# Patient Record
Sex: Male | Born: 1955 | Race: Black or African American | Hispanic: No | Marital: Single | Smoking: Current every day smoker
Health system: Southern US, Community
[De-identification: ages and names within clinical notes are randomized; demographics above are authoritative.]

## PROBLEM LIST (undated history)

## (undated) DIAGNOSIS — K219 Gastro-esophageal reflux disease without esophagitis: Secondary | ICD-10-CM

## (undated) DIAGNOSIS — N400 Enlarged prostate without lower urinary tract symptoms: Secondary | ICD-10-CM

## (undated) DIAGNOSIS — R519 Headache, unspecified: Secondary | ICD-10-CM

## (undated) DIAGNOSIS — T148XXA Other injury of unspecified body region, initial encounter: Secondary | ICD-10-CM

## (undated) DIAGNOSIS — M1990 Unspecified osteoarthritis, unspecified site: Secondary | ICD-10-CM

## (undated) DIAGNOSIS — I10 Essential (primary) hypertension: Secondary | ICD-10-CM

## (undated) DIAGNOSIS — M549 Dorsalgia, unspecified: Secondary | ICD-10-CM

## (undated) DIAGNOSIS — B1920 Unspecified viral hepatitis C without hepatic coma: Secondary | ICD-10-CM

## (undated) DIAGNOSIS — J189 Pneumonia, unspecified organism: Secondary | ICD-10-CM

## (undated) DIAGNOSIS — R51 Headache: Secondary | ICD-10-CM

## (undated) DIAGNOSIS — IMO0001 Reserved for inherently not codable concepts without codable children: Secondary | ICD-10-CM

## (undated) DIAGNOSIS — F1011 Alcohol abuse, in remission: Secondary | ICD-10-CM

## (undated) DIAGNOSIS — I82409 Acute embolism and thrombosis of unspecified deep veins of unspecified lower extremity: Secondary | ICD-10-CM

## (undated) HISTORY — PX: WRIST FRACTURE SURGERY: SHX121

## (undated) HISTORY — DX: Unspecified viral hepatitis C without hepatic coma: B19.20

---

## 2003-10-28 ENCOUNTER — Encounter: Admission: RE | Admit: 2003-10-28 | Discharge: 2003-10-28 | Payer: Self-pay | Admitting: Family Medicine

## 2003-12-09 ENCOUNTER — Encounter: Admission: RE | Admit: 2003-12-09 | Discharge: 2003-12-09 | Payer: Self-pay | Admitting: Orthopedic Surgery

## 2004-09-28 ENCOUNTER — Emergency Department (HOSPITAL_COMMUNITY): Admission: EM | Admit: 2004-09-28 | Discharge: 2004-09-28 | Payer: Self-pay | Admitting: Emergency Medicine

## 2009-03-31 ENCOUNTER — Emergency Department (HOSPITAL_COMMUNITY): Admission: EM | Admit: 2009-03-31 | Discharge: 2009-03-31 | Payer: Self-pay | Admitting: Emergency Medicine

## 2009-05-26 ENCOUNTER — Emergency Department (HOSPITAL_COMMUNITY): Admission: EM | Admit: 2009-05-26 | Discharge: 2009-05-26 | Payer: Self-pay | Admitting: Emergency Medicine

## 2010-04-10 ENCOUNTER — Emergency Department (HOSPITAL_COMMUNITY): Admission: EM | Admit: 2010-04-10 | Discharge: 2010-04-10 | Payer: Self-pay | Admitting: Emergency Medicine

## 2010-04-10 ENCOUNTER — Observation Stay (HOSPITAL_COMMUNITY): Admission: EM | Admit: 2010-04-10 | Discharge: 2010-04-11 | Payer: Self-pay | Admitting: Emergency Medicine

## 2010-04-10 ENCOUNTER — Ambulatory Visit (HOSPITAL_COMMUNITY): Admission: EM | Admit: 2010-04-10 | Discharge: 2010-04-10 | Payer: Self-pay | Admitting: Emergency Medicine

## 2010-04-26 ENCOUNTER — Ambulatory Visit (HOSPITAL_COMMUNITY): Admission: RE | Admit: 2010-04-26 | Discharge: 2010-04-26 | Payer: Self-pay | Admitting: Urology

## 2010-06-15 ENCOUNTER — Emergency Department (HOSPITAL_COMMUNITY)
Admission: EM | Admit: 2010-06-15 | Discharge: 2010-06-15 | Payer: Self-pay | Source: Home / Self Care | Admitting: Emergency Medicine

## 2010-06-17 ENCOUNTER — Emergency Department (HOSPITAL_COMMUNITY): Admission: EM | Admit: 2010-06-17 | Discharge: 2010-05-07 | Payer: Self-pay | Admitting: Emergency Medicine

## 2010-07-31 ENCOUNTER — Encounter: Payer: Self-pay | Admitting: Urology

## 2010-09-21 LAB — URINE CULTURE
Colony Count: NO GROWTH
Culture  Setup Time: 201112061936
Culture: NO GROWTH

## 2010-09-21 LAB — URINALYSIS, ROUTINE W REFLEX MICROSCOPIC
Bilirubin Urine: NEGATIVE
Glucose, UA: NEGATIVE mg/dL
Ketones, ur: NEGATIVE mg/dL
Leukocytes, UA: NEGATIVE
Nitrite: NEGATIVE
Protein, ur: NEGATIVE mg/dL
Specific Gravity, Urine: 1.009 (ref 1.005–1.030)
Urobilinogen, UA: 0.2 mg/dL (ref 0.0–1.0)
pH: 5.5 (ref 5.0–8.0)

## 2010-09-21 LAB — POCT I-STAT, CHEM 8
BUN: 8 mg/dL (ref 6–23)
Calcium, Ion: 1.25 mmol/L (ref 1.12–1.32)
Chloride: 106 mEq/L (ref 96–112)
Creatinine, Ser: 0.9 mg/dL (ref 0.4–1.5)
Glucose, Bld: 86 mg/dL (ref 70–99)
HCT: 45 % (ref 39.0–52.0)
Hemoglobin: 15.3 g/dL (ref 13.0–17.0)
Potassium: 4.2 mEq/L (ref 3.5–5.1)
Sodium: 141 mEq/L (ref 135–145)
TCO2: 26 mmol/L (ref 0–100)

## 2010-09-21 LAB — URINE MICROSCOPIC-ADD ON

## 2010-09-23 LAB — BASIC METABOLIC PANEL
BUN: 11 mg/dL (ref 6–23)
BUN: 7 mg/dL (ref 6–23)
CO2: 22 mEq/L (ref 19–32)
CO2: 27 mEq/L (ref 19–32)
Calcium: 8.6 mg/dL (ref 8.4–10.5)
Calcium: 8.6 mg/dL (ref 8.4–10.5)
Chloride: 101 mEq/L (ref 96–112)
Chloride: 107 mEq/L (ref 96–112)
Creatinine, Ser: 0.67 mg/dL (ref 0.4–1.5)
Creatinine, Ser: 0.76 mg/dL (ref 0.4–1.5)
GFR calc Af Amer: >60 mL/min (ref >60)
GFR calc Af Amer: >60 mL/min (ref >60)
GFR calc non Af Amer: >60 mL/min (ref >60)
GFR calc non Af Amer: >60 mL/min (ref >60)
Glucose, Bld: 130 mg/dL — ABNORMAL HIGH (ref 70–99)
Glucose, Bld: 88 mg/dL (ref 70–99)
Potassium: 3.7 mEq/L (ref 3.5–5.1)
Potassium: 3.7 mEq/L (ref 3.5–5.1)
Sodium: 134 mEq/L — ABNORMAL LOW (ref 135–145)
Sodium: 138 mEq/L (ref 135–145)

## 2010-09-23 LAB — CBC
HCT: 29.4 % — ABNORMAL LOW (ref 39.0–52.0)
HCT: 30.8 % — ABNORMAL LOW (ref 39.0–52.0)
HCT: 36 % — ABNORMAL LOW (ref 39.0–52.0)
Hemoglobin: 10.3 g/dL — ABNORMAL LOW (ref 13.0–17.0)
Hemoglobin: 10.7 g/dL — ABNORMAL LOW (ref 13.0–17.0)
Hemoglobin: 12.6 g/dL — ABNORMAL LOW (ref 13.0–17.0)
MCH: 35.4 pg — ABNORMAL HIGH (ref 26.0–34.0)
MCH: 35.4 pg — ABNORMAL HIGH (ref 26.0–34.0)
MCH: 35.6 pg — ABNORMAL HIGH (ref 26.0–34.0)
MCHC: 34.9 g/dL (ref 30.0–36.0)
MCHC: 34.9 g/dL (ref 30.0–36.0)
MCHC: 35 g/dL (ref 30.0–36.0)
MCV: 101.4 fL — ABNORMAL HIGH (ref 78.0–100.0)
MCV: 101.6 fL — ABNORMAL HIGH (ref 78.0–100.0)
MCV: 101.7 fL — ABNORMAL HIGH (ref 78.0–100.0)
Platelets: 133 10*3/uL — ABNORMAL LOW (ref 150–400)
Platelets: 145 10*3/uL — ABNORMAL LOW (ref 150–400)
Platelets: 166 10*3/uL (ref 150–400)
RBC: 2.9 MIL/uL — ABNORMAL LOW (ref 4.22–5.81)
RBC: 3.03 MIL/uL — ABNORMAL LOW (ref 4.22–5.81)
RBC: 3.53 MIL/uL — ABNORMAL LOW (ref 4.22–5.81)
RDW: 13.4 % (ref 11.5–15.5)
RDW: 13.5 % (ref 11.5–15.5)
RDW: 13.6 % (ref 11.5–15.5)
WBC: 6.3 10*3/uL (ref 4.0–10.5)
WBC: 7.3 10*3/uL (ref 4.0–10.5)
WBC: 8.6 10*3/uL (ref 4.0–10.5)

## 2010-09-23 LAB — DIFFERENTIAL
Basophils Absolute: 0 10*3/uL (ref 0.0–0.1)
Basophils Absolute: 0 10*3/uL (ref 0.0–0.1)
Basophils Relative: 0 % (ref 0–1)
Basophils Relative: 1 % (ref 0–1)
Eosinophils Absolute: 0 10*3/uL (ref 0.0–0.7)
Eosinophils Absolute: 0 10*3/uL (ref 0.0–0.7)
Eosinophils Relative: 0 % (ref 0–5)
Eosinophils Relative: 0 % (ref 0–5)
Lymphocytes Relative: 33 % (ref 12–46)
Lymphocytes Relative: 35 % (ref 12–46)
Lymphs Abs: 2.6 10*3/uL (ref 0.7–4.0)
Lymphs Abs: 2.9 10*3/uL (ref 0.7–4.0)
Monocytes Absolute: 0.6 10*3/uL (ref 0.1–1.0)
Monocytes Absolute: 0.8 10*3/uL (ref 0.1–1.0)
Monocytes Relative: 11 % (ref 3–12)
Monocytes Relative: 7 % (ref 3–12)
Neutro Abs: 3.9 10*3/uL (ref 1.7–7.7)
Neutro Abs: 5 10*3/uL (ref 1.7–7.7)
Neutrophils Relative %: 54 % (ref 43–77)
Neutrophils Relative %: 59 % (ref 43–77)

## 2010-09-23 LAB — POCT I-STAT, CHEM 8
BUN: 9 mg/dL (ref 6–23)
Calcium, Ion: 1.11 mmol/L — ABNORMAL LOW (ref 1.12–1.32)
Chloride: 104 mEq/L (ref 96–112)
Creatinine, Ser: 0.7 mg/dL (ref 0.4–1.5)
Glucose, Bld: 87 mg/dL (ref 70–99)
HCT: 35 % — ABNORMAL LOW (ref 39.0–52.0)
Hemoglobin: 11.9 g/dL — ABNORMAL LOW (ref 13.0–17.0)
Potassium: 4 mEq/L (ref 3.5–5.1)
Sodium: 137 mEq/L (ref 135–145)
TCO2: 24 mmol/L (ref 0–100)

## 2010-09-23 LAB — APTT: aPTT: 30 seconds (ref 24–37)

## 2010-09-23 LAB — PROTIME-INR
INR: 1.08 (ref 0.00–1.49)
INR: 1.08 (ref 0.00–1.49)
Prothrombin Time: 14.2 seconds (ref 11.6–15.2)
Prothrombin Time: 14.2 seconds (ref 11.6–15.2)

## 2010-09-23 LAB — SAMPLE TO BLOOD BANK

## 2010-11-26 NOTE — Op Note (Signed)
NAME:  Danny Hansen, Danny Hansen NO.:  [id number]   MEDICAL RECORD NO.:  [id number]          PATIENT TYPE:  EMS   LOCATION:  ED                           FACILITY:  [facility]   PHYSICIAN:  Cindee Salt, M.D.       DATE OF BIRTH:  1955-12-10   DATE OF PROCEDURE:  09/28/2004  DATE OF DISCHARGE:                                 OPERATIVE REPORT   PREOPERATIVE DIAGNOSIS:  Skeletonization distal phalanx right middle finger.   POSTOPERATIVE DIAGNOSIS:  Skeletonization distal phalanx right middle  finger.   OPERATION:  Revision amputation right middle finger.   SURGEON:  Cindee Salt, M.D.   ASSISTANTCarolyne Fiscal.   ANESTHESIA:  Metacarpal block.   HISTORY:  The patient is a 55 year old male who suffered an amputation to  the tip of his right middle finger in the fan belt of a car when a fellow  worker suddenly turned this on.  He is referred from urgent care with loss  of the entire pulp dorsal component. The remainder of the fingertip is  skeletonized.  There is volar soft tissue, no nail.  The amputated specimen  is not available.   DESCRIPTION OF PROCEDURE:  The patient was given a metacarpal block with 1%  Xylocaine without epinephrine, prepped using Betadine solution.  A Penrose  drain was used for tourniquet control.  The nail matrix was then excised  with sharp dissection, taking care to remove the corners.  This was excised  in toto.  A rongeur was then used to cut the bone back to the level of the  dorsal skin.  The volar fatty tissue was then dorsally rotated and sutured,  covering the entire exposed bone.  This was sutured into position after  irrigation with interrupted 5-0 chromic sutures fully covering the exposed  bone.  The __________ would be allowed to reepithelialize.  A sterile  compressive dressing and splint was applied.  The patient tolerated the  procedure well.  He is discharged home to return to the [facility] of  [place] in one week on Vicodin and  Keflex.      GK/MEDQ  D:  09/28/2004  T:  09/28/2004  Job:  [id number]

## 2013-01-27 ENCOUNTER — Encounter (HOSPITAL_COMMUNITY): Payer: Self-pay | Admitting: Emergency Medicine

## 2013-01-27 ENCOUNTER — Emergency Department (HOSPITAL_COMMUNITY)
Admission: EM | Admit: 2013-01-27 | Discharge: 2013-01-27 | Disposition: A | Discharge status: Home / Self Care | Payer: Self-pay | Attending: Emergency Medicine | Admitting: Emergency Medicine

## 2013-01-27 DIAGNOSIS — W1809XA Striking against other object with subsequent fall, initial encounter: Secondary | ICD-10-CM | POA: Insufficient documentation

## 2013-01-27 DIAGNOSIS — S0100XA Unspecified open wound of scalp, initial encounter: Secondary | ICD-10-CM | POA: Insufficient documentation

## 2013-01-27 DIAGNOSIS — F10220 Alcohol dependence with intoxication, uncomplicated: Secondary | ICD-10-CM

## 2013-01-27 DIAGNOSIS — F17200 Nicotine dependence, unspecified, uncomplicated: Secondary | ICD-10-CM | POA: Insufficient documentation

## 2013-01-27 DIAGNOSIS — Y939 Activity, unspecified: Secondary | ICD-10-CM | POA: Insufficient documentation

## 2013-01-27 DIAGNOSIS — Z23 Encounter for immunization: Secondary | ICD-10-CM | POA: Insufficient documentation

## 2013-01-27 DIAGNOSIS — Y929 Unspecified place or not applicable: Secondary | ICD-10-CM | POA: Insufficient documentation

## 2013-01-27 DIAGNOSIS — F10229 Alcohol dependence with intoxication, unspecified: Secondary | ICD-10-CM | POA: Insufficient documentation

## 2013-01-27 DIAGNOSIS — S0101XA Laceration without foreign body of scalp, initial encounter: Secondary | ICD-10-CM

## 2013-01-27 LAB — CBC WITH DIFFERENTIAL/PLATELET
Basophils Absolute: 0 10*3/uL (ref 0.0–0.1)
Basophils Relative: 0 % (ref 0–1)
Eosinophils Relative: 1 % (ref 0–5)
HCT: 35.6 % — ABNORMAL LOW (ref 39.0–52.0)
Hemoglobin: 12.4 g/dL — ABNORMAL LOW (ref 13.0–17.0)
Lymphocytes Relative: 75 % — ABNORMAL HIGH (ref 12–46)
Lymphs Abs: 5.4 10*3/uL — ABNORMAL HIGH (ref 0.7–4.0)
MCHC: 34.8 g/dL (ref 30.0–36.0)
MCV: 98.1 fL (ref 78.0–100.0)
Monocytes Absolute: 0.4 10*3/uL (ref 0.1–1.0)
Neutro Abs: 1.3 10*3/uL — ABNORMAL LOW (ref 1.7–7.7)
Neutrophils Relative %: 17 % — ABNORMAL LOW (ref 43–77)
Platelets: 177 10*3/uL (ref 150–400)
RBC: 3.63 MIL/uL — ABNORMAL LOW (ref 4.22–5.81)
RDW: 13.6 % (ref 11.5–15.5)
WBC: 7.2 10*3/uL (ref 4.0–10.5)

## 2013-01-27 MED ORDER — SODIUM CHLORIDE 0.9 % IV BOLUS (SEPSIS)
1000.0000 mL | Freq: Once | INTRAVENOUS | Status: AC
  Administered 2013-01-27: 1000 mL via INTRAVENOUS

## 2013-01-27 MED ORDER — TETANUS-DIPHTH-ACELL PERTUSSIS 5-2.5-18.5 LF-MCG/0.5 IM SUSP
0.5000 mL | Freq: Once | INTRAMUSCULAR | Status: AC
  Administered 2013-01-27: 0.5 mL via INTRAMUSCULAR
  Filled 2013-01-27 (×2): qty 0.5

## 2013-01-27 NOTE — ED Provider Notes (Addendum)
Tripped and fell striking his left neck on a glass coffee table. Creating laceration. No other injury Patient had small arterial bleed at left posterior neck on arrival. There is no visible hematoma. Bleeding was stopped immediately with one figure-of-eight suture placed by resident physician. Patient is alert Glasgow Coma Score 15 no distress I supervised the laceration repair Doug Sou, MD 01/28/13 1610  Doug Sou, MD 02/06/13 1013

## 2013-01-27 NOTE — ED Notes (Signed)
Patient up and pacing around in the room.

## 2013-01-27 NOTE — ED Notes (Signed)
Patient reports started drinking at 1200, a fifth of vodka and margarita mix. Reports fell over glass table, then went to sleep about 1500, then about 1700 woke up in a pool of blood. Laceration behind left ear; was bleeding heavily until sutured by [facility]. VSS.

## 2013-01-27 NOTE — ED Notes (Signed)
Nicky Pugh, MD at bedside. Suturing bleeding wound at this time.

## 2013-01-27 NOTE — ED Notes (Signed)
Discharge and follow up instructions reviewed with pt. Pt verbalized understanding.  

## 2013-01-27 NOTE — ED Provider Notes (Signed)
History    CSN: [id number] Arrival date & time 01/27/13  1820  First MD Initiated Contact with Patient 01/27/13 1836     Chief Complaint  Patient presents with  . Fall  . Ear Laceration   (Consider location/radiation/quality/duration/timing/severity/associated sxs/prior Treatment) HPI Comments: Pt w/ no PMHx, was drinking large quantity ETOH today, fell into glass coffee table and passed out. Awoke several hrs later in pool of blood w/ laceration noted behind left ear. Brought to ED by POV, hypotensive w/ pressure 82/58 in triage. Bleeding controlled w/ pressure. Pt denies any complaints. States he does not remember falling.   Patient is a 57 y.o. male presenting with injury. The history is provided by the patient. No language interpreter was used.  Injury This is a new problem. The current episode started today. The problem occurs constantly. The problem has been unchanged. He has tried nothing for the symptoms. The treatment provided no relief.   History reviewed. No pertinent past medical history. History reviewed. No pertinent past surgical history. No family history on file. History  Substance Use Topics  . Smoking status: Current Every Day Smoker  . Smokeless tobacco: Not on file  . Alcohol Use: Yes    Review of Systems  Respiratory: Negative for shortness of breath.   Cardiovascular: Negative for leg swelling.  Gastrointestinal: Negative for diarrhea and constipation.  Genitourinary: Negative for dysuria and frequency.  Skin: Positive for wound. Negative for color change.  Neurological: Negative for dizziness.  Psychiatric/Behavioral: Negative for confusion and agitation.  All other systems reviewed and are negative.    Allergies  Review of patient's allergies indicates no known allergies.  Home Medications  No current outpatient prescriptions on file. BP 104/74  Pulse 97  Temp(Src) 98 F (36.7 C) (Oral)  Resp 20  Ht 5\' 6"  (1.676 m)  Wt 160 lb (72.576 kg)   BMI 25.84 kg/m2  SpO2 99% Physical Exam  Constitutional: He is oriented to person, place, and time. He appears well-developed and well-nourished. No distress.  HENT:  Head: Normocephalic and atraumatic.  Ears:  Eyes: EOM are normal. Pupils are equal, round, and reactive to light.  Neck: Normal range of motion. Neck supple.  Cardiovascular: Normal rate and regular rhythm.   Pulmonary/Chest: Effort normal. No respiratory distress.  Abdominal: Soft. He exhibits no distension.  Musculoskeletal: Normal range of motion. He exhibits no edema.  Neurological: He is alert and oriented to person, place, and time.  Skin: Skin is warm and dry.  Psychiatric: He has a normal mood and affect. His behavior is normal.    ED Course  LACERATION REPAIR Date/Time: 01/28/2013 2:58 AM Performed by: Audelia Hives Authorized by: Audelia Hives Consent: The procedure was performed in an emergent situation. Body area: head/neck Location details: scalp Laceration length: 1 cm Foreign bodies: no foreign bodies Tendon involvement: none Nerve involvement: none Vascular damage: yes (arterial) Anesthesia: local infiltration Local anesthetic: lidocaine 1% with epinephrine Anesthetic total: 3 ml Skin closure: 4-0 nylon Number of sutures: 2 Technique: simple (figure 8) Approximation: close Approximation difficulty: simple Patient tolerance: Patient tolerated the procedure well with no immediate complications. Comments: One figure 8 suture and one simple interrupted    (including critical care time) Labs Reviewed  CBC WITH DIFFERENTIAL   Results for orders placed during the hospital encounter of 01/27/13  CBC WITH DIFFERENTIAL      Result Value Range   WBC 7.2  4.0 - 10.5 K/uL   RBC 3.63 (*) 4.22 - 5.81 MIL/uL  Hemoglobin 12.4 (*) 13.0 - 17.0 g/dL   HCT 16.1 (*) 09.6 - 04.5 %   MCV 98.1  78.0 - 100.0 fL   MCH 34.2 (*) 26.0 - 34.0 pg   MCHC 34.8  30.0 - 36.0 g/dL   RDW 40.9  81.1 - 91.4 %    Platelets 177  150 - 400 K/uL   Neutrophils Relative % 17 (*) 43 - 77 %   Neutro Abs 1.3 (*) 1.7 - 7.7 K/uL   Lymphocytes Relative 75 (*) 12 - 46 %   Lymphs Abs 5.4 (*) 0.7 - 4.0 K/uL   Monocytes Relative 6  3 - 12 %   Monocytes Absolute 0.4  0.1 - 1.0 K/uL   Eosinophils Relative 1  0 - 5 %   Eosinophils Absolute 0.1  0.0 - 0.7 K/uL   Basophils Relative 0  0 - 1 %   Basophils Absolute 0.0  0.0 - 0.1 K/uL    No results found. No diagnosis found.  MDM  Exam as above, normotensive, intoxicated. Small 1cm laceration to left posterior ear w/ pulsatile bleeding - controlled w/ pressure, pulsatile bleeding - figure 8 suture resolved bleeding, no hematoma formation. Normotensive. No other signs of trauma on exam. tetanus updated, given 1 L IVF, orthostatics negative, Hgb 12.4. Observed in ED for several hours w/out hypotension. Clinically sober. Able to ambulate w/out event. Stable for d/c home. Given instructions to return in 5 days for suture removal. Given return precautions. D/c in good condition. Doubt intracranial abnormality, no bony tenderness or focal neuro findings once reassessed and sober.   Case d/w Dr Ethelda Chick  1. Laceration of scalp with complication, initial encounter   2. Alcohol intoxication in active alcoholic, uncomplicated    [facility] EMERGENCY DEPARTMENT [address] [id number] [place] [address] [phone] In 5 days for suture removal      Audelia Hives, MD 01/28/13 0300

## 2013-01-27 NOTE — ED Notes (Signed)
Pt reports that he has been drinking and fell onto a glass table. Pt presents with laceration to left ear. Pt went to sleep after incident.

## 2013-01-28 NOTE — ED Provider Notes (Signed)
I have personally seen and examined the patient.  I have discussed the plan of care with the resident.  I have reviewed the documentation on PMH/FH/Soc. History.  I have reviewed the documentation of the resident and agree.  Doug Sou, MD 01/28/13 1752

## 2013-02-01 ENCOUNTER — Emergency Department (HOSPITAL_COMMUNITY)
Admission: EM | Admit: 2013-02-01 | Discharge: 2013-02-01 | Disposition: A | Discharge status: Home / Self Care | Payer: Self-pay | Attending: Emergency Medicine | Admitting: Emergency Medicine

## 2013-02-01 ENCOUNTER — Encounter (HOSPITAL_COMMUNITY): Payer: Self-pay | Admitting: Emergency Medicine

## 2013-02-01 DIAGNOSIS — F17200 Nicotine dependence, unspecified, uncomplicated: Secondary | ICD-10-CM | POA: Insufficient documentation

## 2013-02-01 DIAGNOSIS — Z4802 Encounter for removal of sutures: Secondary | ICD-10-CM | POA: Insufficient documentation

## 2013-02-01 NOTE — ED Notes (Signed)
Pt here for suture removal from 7/20. Sutures intact behind left ear.

## 2013-02-01 NOTE — ED Notes (Signed)
Here for suture removal left ear that were placed 5 days ago area healing

## 2013-02-01 NOTE — ED Provider Notes (Signed)
  CSN: [id number]     Arrival date & time 02/01/13  1123 History     First MD Initiated Contact with Patient 02/01/13 1228     Chief Complaint  Patient presents with  . Suture / Staple Removal   (Consider location/radiation/quality/duration/timing/severity/associated sxs/prior Treatment) HPI Comments: 57 yo AAM presents for suture removal. Injury occurred 5 days ago after a fall on glass resulting in a laceration posterior to the L ear. He had one figure 8 suture and one simple interrupted suture placed at the time. He denies purulent drainage, fever, chills, swelling or redness of the area, headache, and any other pain since the sutures were placed.  No treatment of the area since the sutures were placed.    Patient is a 57 y.o. male presenting with suture removal. The history is provided by the patient.  Suture / Staple Removal    History reviewed. No pertinent past medical history. History reviewed. No pertinent past surgical history. No family history on file. History  Substance Use Topics  . Smoking status: Current Every Day Smoker  . Smokeless tobacco: Not on file  . Alcohol Use: Yes    Review of Systems  All other systems reviewed and are negative.    Allergies  Review of patient's allergies indicates no known allergies.  Home Medications   Current Outpatient Rx  Name  Route  Sig  Dispense  Refill  . acetaminophen (TYLENOL) 325 MG tablet   Oral   Take 650 mg by mouth every 6 (six) hours as needed for pain.          BP 128/80  Pulse 83  Temp(Src) 97.4 F (36.3 C)  Resp 16  SpO2 99% Physical Exam  Nursing note and vitals reviewed. Constitutional: He appears well-developed and well-nourished. No distress.  HENT:  Head: Normocephalic and atraumatic.  Cardiovascular: Normal rate, regular rhythm and normal heart sounds.   Pulmonary/Chest: Effort normal and breath sounds normal.  Neurological: He is alert.  Skin: He is not diaphoretic.  Sutures posterior to  the left ear intact.  Area clean and dry.  No drainage.  Skin well approximated.  No surrounding erythema or edema.  Psychiatric: He has a normal mood and affect.    ED Course   Procedures (including critical care time)  Labs Reviewed - No data to display No results found. 1. Visit for suture removal     MDM  Patient presenting for suture removal.  Skin appears to be well approximated.  No signs of infection at this time.  Sutures removed.  Return precautions given.  Pascal Lux [facility], PA-C 02/02/13 1302

## 2013-02-05 NOTE — ED Provider Notes (Signed)
Medical screening examination/treatment/procedure(s) were performed by non-physician practitioner and as supervising physician I was immediately available for consultation/collaboration.   Ashby Dawes, MD 02/05/13 [phone]

## 2013-07-11 DIAGNOSIS — I82409 Acute embolism and thrombosis of unspecified deep veins of unspecified lower extremity: Secondary | ICD-10-CM

## 2013-07-11 HISTORY — DX: Acute embolism and thrombosis of unspecified deep veins of unspecified lower extremity: I82.409

## 2014-02-08 DIAGNOSIS — T148XXA Other injury of unspecified body region, initial encounter: Secondary | ICD-10-CM

## 2014-02-08 HISTORY — DX: Other injury of unspecified body region, initial encounter: T14.8XXA

## 2014-02-25 ENCOUNTER — Emergency Department (HOSPITAL_COMMUNITY): Payer: No Typology Code available for payment source

## 2014-02-25 ENCOUNTER — Inpatient Hospital Stay (HOSPITAL_COMMUNITY)
Admission: EM | Admit: 2014-02-25 | Discharge: 2014-03-04 | DRG: 956 | Disposition: A | Discharge status: Rehabilitation Facility | Payer: No Typology Code available for payment source | Attending: Orthopaedic Surgery | Admitting: Orthopaedic Surgery

## 2014-02-25 ENCOUNTER — Encounter (HOSPITAL_COMMUNITY): Payer: Self-pay

## 2014-02-25 DIAGNOSIS — N35919 Unspecified urethral stricture, male, unspecified site: Secondary | ICD-10-CM | POA: Diagnosis present

## 2014-02-25 DIAGNOSIS — F10920 Alcohol use, unspecified with intoxication, uncomplicated: Secondary | ICD-10-CM

## 2014-02-25 DIAGNOSIS — S32409A Unspecified fracture of unspecified acetabulum, initial encounter for closed fracture: Secondary | ICD-10-CM | POA: Diagnosis present

## 2014-02-25 DIAGNOSIS — S72402B Unspecified fracture of lower end of left femur, initial encounter for open fracture type I or II: Secondary | ICD-10-CM | POA: Diagnosis present

## 2014-02-25 DIAGNOSIS — S32401A Unspecified fracture of right acetabulum, initial encounter for closed fracture: Secondary | ICD-10-CM | POA: Diagnosis present

## 2014-02-25 DIAGNOSIS — S72443B Displaced fracture of lower epiphysis (separation) of unspecified femur, initial encounter for open fracture type I or II: Principal | ICD-10-CM | POA: Diagnosis present

## 2014-02-25 DIAGNOSIS — B1920 Unspecified viral hepatitis C without hepatic coma: Secondary | ICD-10-CM | POA: Diagnosis present

## 2014-02-25 DIAGNOSIS — Z23 Encounter for immunization: Secondary | ICD-10-CM

## 2014-02-25 DIAGNOSIS — S3710XA Unspecified injury of ureter, initial encounter: Secondary | ICD-10-CM | POA: Diagnosis present

## 2014-02-25 DIAGNOSIS — B182 Chronic viral hepatitis C: Secondary | ICD-10-CM | POA: Diagnosis present

## 2014-02-25 DIAGNOSIS — F1910 Other psychoactive substance abuse, uncomplicated: Secondary | ICD-10-CM | POA: Diagnosis present

## 2014-02-25 DIAGNOSIS — D62 Acute posthemorrhagic anemia: Secondary | ICD-10-CM | POA: Diagnosis not present

## 2014-02-25 DIAGNOSIS — I10 Essential (primary) hypertension: Secondary | ICD-10-CM | POA: Diagnosis present

## 2014-02-25 DIAGNOSIS — S7292XC Unspecified fracture of left femur, initial encounter for open fracture type IIIA, IIIB, or IIIC: Secondary | ICD-10-CM | POA: Diagnosis present

## 2014-02-25 DIAGNOSIS — F17200 Nicotine dependence, unspecified, uncomplicated: Secondary | ICD-10-CM | POA: Diagnosis present

## 2014-02-25 DIAGNOSIS — F1020 Alcohol dependence, uncomplicated: Secondary | ICD-10-CM | POA: Diagnosis present

## 2014-02-25 HISTORY — DX: Benign prostatic hyperplasia without lower urinary tract symptoms: N40.0

## 2014-02-25 HISTORY — DX: Other injury of unspecified body region, initial encounter: T14.8XXA

## 2014-02-25 HISTORY — DX: Essential (primary) hypertension: I10

## 2014-02-25 LAB — COMPREHENSIVE METABOLIC PANEL
ALBUMIN: 3.4 g/dL — AB (ref 3.5–5.2)
ALT: 194 U/L — ABNORMAL HIGH (ref 0–53)
ANION GAP: 15 (ref 5–15)
AST: 265 U/L — ABNORMAL HIGH (ref 0–37)
Alkaline Phosphatase: 116 U/L (ref 39–117)
BUN: 12 mg/dL (ref 6–23)
CALCIUM: 8.9 mg/dL (ref 8.4–10.5)
CO2: 21 mEq/L (ref 19–32)
Chloride: 103 mEq/L (ref 96–112)
Creatinine, Ser: 0.83 mg/dL (ref 0.50–1.35)
GFR calc non Af Amer: >90 mL/min (ref >90)
Glucose, Bld: 141 mg/dL — ABNORMAL HIGH (ref 70–99)
Potassium: 3.9 mEq/L (ref 3.7–5.3)
Sodium: 139 mEq/L (ref 137–147)
TOTAL PROTEIN: 8.3 g/dL (ref 6.0–8.3)
Total Bilirubin: 0.5 mg/dL (ref 0.3–1.2)

## 2014-02-25 LAB — CBC WITH DIFFERENTIAL/PLATELET
BASOS PCT: 0 % (ref 0–1)
Basophils Absolute: 0 10*3/uL (ref 0.0–0.1)
EOS ABS: 0.1 10*3/uL (ref 0.0–0.7)
EOS PCT: 1 % (ref 0–5)
HCT: 40.7 % (ref 39.0–52.0)
Hemoglobin: 14.1 g/dL (ref 13.0–17.0)
LYMPHS ABS: 5.3 10*3/uL — AB (ref 0.7–4.0)
Lymphocytes Relative: 67 % — ABNORMAL HIGH (ref 12–46)
MCH: 35.3 pg — AB (ref 26.0–34.0)
MCHC: 34.6 g/dL (ref 30.0–36.0)
MCV: 101.8 fL — AB (ref 78.0–100.0)
Monocytes Absolute: 0.4 10*3/uL (ref 0.1–1.0)
Monocytes Relative: 6 % (ref 3–12)
Neutro Abs: 2.1 10*3/uL (ref 1.7–7.7)
Neutrophils Relative %: 26 % — ABNORMAL LOW (ref 43–77)
PLATELETS: 149 10*3/uL — AB (ref 150–400)
RBC: 4 MIL/uL — AB (ref 4.22–5.81)
RDW: 12.5 % (ref 11.5–15.5)
WBC: 8 10*3/uL (ref 4.0–10.5)

## 2014-02-25 LAB — ETHANOL: ALCOHOL ETHYL (B): 214 mg/dL — AB (ref 0–11)

## 2014-02-25 MED ORDER — TETANUS-DIPHTHERIA TOXOIDS TD 5-2 LFU IM INJ
0.5000 mL | INJECTION | Freq: Once | INTRAMUSCULAR | Status: DC
  Filled 2014-02-25: qty 0.5

## 2014-02-25 MED ORDER — TETANUS-DIPHTH-ACELL PERTUSSIS 5-2.5-18.5 LF-MCG/0.5 IM SUSP
0.5000 mL | Freq: Once | INTRAMUSCULAR | Status: AC
Start: 2014-02-25 — End: 2014-02-25
  Administered 2014-02-25: 0.5 mL via INTRAMUSCULAR

## 2014-02-25 MED ORDER — HYDROMORPHONE HCL PF 1 MG/ML IJ SOLN
1.0000 mg | Freq: Once | INTRAMUSCULAR | Status: AC
  Administered 2014-02-25: 1 mg via INTRAVENOUS
  Filled 2014-02-25: qty 1

## 2014-02-25 MED ORDER — IOHEXOL 300 MG/ML  SOLN
100.0000 mL | Freq: Once | INTRAMUSCULAR | Status: AC | PRN
  Administered 2014-02-25: 100 mL via INTRAVENOUS

## 2014-02-25 MED ORDER — SODIUM CHLORIDE 0.9 % IV SOLN
INTRAVENOUS | Status: AC | PRN
  Administered 2014-02-25: 125 mL/h via INTRAVENOUS

## 2014-02-25 MED ORDER — FENTANYL CITRATE 0.05 MG/ML IJ SOLN
INTRAMUSCULAR | Status: AC
  Administered 2014-02-25: 100 ug
  Filled 2014-02-25: qty 2

## 2014-02-25 MED ORDER — CEFAZOLIN SODIUM 1-5 GM-% IV SOLN
1.0000 g | Freq: Once | INTRAVENOUS | Status: AC
  Administered 2014-02-25: 1 g via INTRAVENOUS
  Filled 2014-02-25: qty 50

## 2014-02-25 MED ORDER — HYDROMORPHONE HCL PF 1 MG/ML IJ SOLN
2.0000 mg | Freq: Once | INTRAMUSCULAR | Status: AC
  Administered 2014-02-25: 2 mg via INTRAVENOUS
  Filled 2014-02-25: qty 2
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
## 2014-02-25 NOTE — ED Notes (Signed)
Dr. Magnus IvanBlackman and Ortho Tech. Splinted lt. Leg.
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
## 2014-02-25 NOTE — H&P (Signed)
  See Orthopedic consult note

## 2014-02-25 NOTE — Progress Notes (Signed)
Orthopedic Tech Progress Note Patient Details:  Georjean Modearnest Lee Hanning [birth year] [id number]  Ortho Devices Type of Ortho Device: Ace wrap;Stirrup splint;Post (long leg) splint Ortho Device/Splint Location: LLE Ortho Device/Splint Interventions: Ordered;Application   Jennye MoccasinHughes, Lochlyn Zullo Craig 02/25/2014, 11:01 PM

## 2014-02-25 NOTE — ED Notes (Signed)
Continues to sleep.  Urinal between legs, awaiting UA.  Pupils pinpoint; pt does awaken with tactile stimulation.  + bilat pedal pulses present. ST on monitor.  IV patent and infusing without difficulty.

## 2014-02-26 ENCOUNTER — Inpatient Hospital Stay (HOSPITAL_COMMUNITY): Payer: No Typology Code available for payment source

## 2014-02-26 ENCOUNTER — Encounter (HOSPITAL_COMMUNITY): Admission: EM | Disposition: A | Discharge status: Rehabilitation Facility | Payer: Self-pay | Source: Home / Self Care

## 2014-02-26 ENCOUNTER — Encounter (HOSPITAL_COMMUNITY): Payer: Self-pay | Admitting: General Practice

## 2014-02-26 ENCOUNTER — Encounter (HOSPITAL_COMMUNITY): Payer: No Typology Code available for payment source | Admitting: Anesthesiology

## 2014-02-26 ENCOUNTER — Inpatient Hospital Stay (HOSPITAL_COMMUNITY): Payer: No Typology Code available for payment source | Admitting: Anesthesiology

## 2014-02-26 DIAGNOSIS — F1020 Alcohol dependence, uncomplicated: Secondary | ICD-10-CM | POA: Diagnosis present

## 2014-02-26 DIAGNOSIS — D62 Acute posthemorrhagic anemia: Secondary | ICD-10-CM | POA: Diagnosis present

## 2014-02-26 DIAGNOSIS — F1910 Other psychoactive substance abuse, uncomplicated: Secondary | ICD-10-CM | POA: Diagnosis present

## 2014-02-26 DIAGNOSIS — F17200 Nicotine dependence, unspecified, uncomplicated: Secondary | ICD-10-CM | POA: Diagnosis present

## 2014-02-26 DIAGNOSIS — S31109A Unspecified open wound of abdominal wall, unspecified quadrant without penetration into peritoneal cavity, initial encounter: Secondary | ICD-10-CM

## 2014-02-26 DIAGNOSIS — S32409A Unspecified fracture of unspecified acetabulum, initial encounter for closed fracture: Secondary | ICD-10-CM

## 2014-02-26 DIAGNOSIS — S7290XA Unspecified fracture of unspecified femur, initial encounter for closed fracture: Secondary | ICD-10-CM

## 2014-02-26 DIAGNOSIS — I10 Essential (primary) hypertension: Secondary | ICD-10-CM | POA: Diagnosis present

## 2014-02-26 DIAGNOSIS — B1920 Unspecified viral hepatitis C without hepatic coma: Secondary | ICD-10-CM | POA: Diagnosis present

## 2014-02-26 DIAGNOSIS — S32401A Unspecified fracture of right acetabulum, initial encounter for closed fracture: Secondary | ICD-10-CM | POA: Diagnosis present

## 2014-02-26 DIAGNOSIS — Z23 Encounter for immunization: Secondary | ICD-10-CM | POA: Diagnosis not present

## 2014-02-26 DIAGNOSIS — S72443B Displaced fracture of lower epiphysis (separation) of unspecified femur, initial encounter for open fracture type I or II: Secondary | ICD-10-CM | POA: Diagnosis present

## 2014-02-26 DIAGNOSIS — S72402B Unspecified fracture of lower end of left femur, initial encounter for open fracture type I or II: Secondary | ICD-10-CM | POA: Diagnosis present

## 2014-02-26 DIAGNOSIS — N35919 Unspecified urethral stricture, male, unspecified site: Secondary | ICD-10-CM | POA: Diagnosis present

## 2014-02-26 HISTORY — PX: I&D EXTREMITY: SHX5045

## 2014-02-26 HISTORY — PX: EXTERNAL FIXATION LEG: SHX1549

## 2014-02-26 LAB — CBC
HCT: 33.6 % — ABNORMAL LOW (ref 39.0–52.0)
Hemoglobin: 11.6 g/dL — ABNORMAL LOW (ref 13.0–17.0)
MCH: 35 pg — ABNORMAL HIGH (ref 26.0–34.0)
MCHC: 34.5 g/dL (ref 30.0–36.0)
MCV: 101.5 fL — ABNORMAL HIGH (ref 78.0–100.0)
PLATELETS: 150 10*3/uL (ref 150–400)
RBC: 3.31 MIL/uL — ABNORMAL LOW (ref 4.22–5.81)
RDW: 12.6 % (ref 11.5–15.5)
WBC: 9.9 10*3/uL (ref 4.0–10.5)

## 2014-02-26 LAB — MRSA PCR SCREENING: MRSA by PCR: NEGATIVE

## 2014-02-26 LAB — ABO/RH: ABO/RH(D): O POS

## 2014-02-26 LAB — GAMMA GT: GGT: 268 U/L — AB (ref 7–51)

## 2014-02-26 LAB — BASIC METABOLIC PANEL
ANION GAP: 17 — AB (ref 5–15)
BUN: 11 mg/dL (ref 6–23)
CHLORIDE: 104 meq/L (ref 96–112)
CO2: 20 mEq/L (ref 19–32)
CREATININE: 0.66 mg/dL (ref 0.50–1.35)
Calcium: 8.5 mg/dL (ref 8.4–10.5)
GFR calc non Af Amer: >90 mL/min (ref >90)
Glucose, Bld: 154 mg/dL — ABNORMAL HIGH (ref 70–99)
Potassium: 4 mEq/L (ref 3.7–5.3)
Sodium: 141 mEq/L (ref 137–147)

## 2014-02-26 LAB — RAPID URINE DRUG SCREEN, HOSP PERFORMED
AMPHETAMINES: NOT DETECTED
Barbiturates: NOT DETECTED
Benzodiazepines: NOT DETECTED
Cocaine: POSITIVE — AB
Opiates: POSITIVE — AB
Tetrahydrocannabinol: NOT DETECTED

## 2014-02-26 SURGERY — EXTERNAL FIXATION, LOWER EXTREMITY
Anesthesia: General | Site: Leg Lower | Laterality: Left

## 2014-02-26 MED ORDER — TAMSULOSIN HCL 0.4 MG PO CAPS
0.4000 mg | ORAL_CAPSULE | Freq: Every day | ORAL | Status: DC
  Administered 2014-02-27 – 2014-03-03 (×4): 0.4 mg via ORAL
  Filled 2014-02-26 (×6): qty 1

## 2014-02-26 MED ORDER — PHENYLEPHRINE 40 MCG/ML (10ML) SYRINGE FOR IV PUSH (FOR BLOOD PRESSURE SUPPORT)
PREFILLED_SYRINGE | INTRAVENOUS | Status: AC
  Filled 2014-02-26: qty 10

## 2014-02-26 MED ORDER — LIDOCAINE HCL (CARDIAC) 20 MG/ML IV SOLN
INTRAVENOUS | Status: AC
  Filled 2014-02-26: qty 5

## 2014-02-26 MED ORDER — HYDROCODONE-ACETAMINOPHEN 5-325 MG PO TABS
1.0000 | ORAL_TABLET | ORAL | Status: DC | PRN
  Administered 2014-02-26 – 2014-02-27 (×2): 2 via ORAL
  Filled 2014-02-26 (×2): qty 2

## 2014-02-26 MED ORDER — LACTATED RINGERS IV SOLN: INTRAVENOUS | Status: DC

## 2014-02-26 MED ORDER — CLINDAMYCIN PHOSPHATE 900 MG/50ML IV SOLN
900.0000 mg | INTRAVENOUS | Status: AC
  Administered 2014-02-26: 900 mg via INTRAVENOUS
  Filled 2014-02-26: qty 50

## 2014-02-26 MED ORDER — ADULT MULTIVITAMIN W/MINERALS CH
1.0000 | ORAL_TABLET | Freq: Every day | ORAL | Status: DC
  Filled 2014-02-26: qty 1

## 2014-02-26 MED ORDER — LORAZEPAM 2 MG/ML IJ SOLN: 1.0000 mg | Freq: Four times a day (QID) | INTRAMUSCULAR | Status: DC | PRN

## 2014-02-26 MED ORDER — DEXTROSE 5 % IV SOLN
500.0000 mg | Freq: Four times a day (QID) | INTRAVENOUS | Status: DC | PRN
  Filled 2014-02-26: qty 5

## 2014-02-26 MED ORDER — HYDROMORPHONE HCL PF 1 MG/ML IJ SOLN
INTRAMUSCULAR | Status: AC
  Administered 2014-02-26: 0.5 mg via INTRAVENOUS
  Filled 2014-02-26: qty 1

## 2014-02-26 MED ORDER — SODIUM CHLORIDE 0.9 % IV SOLN
INTRAVENOUS | Status: DC
  Administered 2014-02-26: 03:00:00 via INTRAVENOUS

## 2014-02-26 MED ORDER — ONDANSETRON HCL 4 MG/2ML IJ SOLN
INTRAMUSCULAR | Status: DC | PRN
  Administered 2014-02-26: 4 mg via INTRAVENOUS

## 2014-02-26 MED ORDER — VITAMIN B-1 100 MG PO TABS
100.0000 mg | ORAL_TABLET | Freq: Every day | ORAL | Status: DC
Start: 2014-02-26 — End: 2014-02-26
  Filled 2014-02-26: qty 1

## 2014-02-26 MED ORDER — POLYETHYLENE GLYCOL 3350 17 G PO PACK
17.0000 g | PACK | Freq: Every day | ORAL | Status: DC | PRN
Start: 2014-02-26 — End: 2014-03-03
  Filled 2014-02-26: qty 1

## 2014-02-26 MED ORDER — MAGNESIUM CITRATE PO SOLN: 1.0000 | Freq: Once | ORAL | Status: AC | PRN

## 2014-02-26 MED ORDER — FENTANYL CITRATE 0.05 MG/ML IJ SOLN
INTRAMUSCULAR | Status: DC | PRN
  Administered 2014-02-26 (×3): 50 ug via INTRAVENOUS

## 2014-02-26 MED ORDER — HYDROMORPHONE HCL PF 1 MG/ML IJ SOLN
1.0000 mg | INTRAMUSCULAR | Status: DC | PRN
  Administered 2014-02-26 (×2): 1 mg via INTRAVENOUS
  Filled 2014-02-26 (×2): qty 1

## 2014-02-26 MED ORDER — METOCLOPRAMIDE HCL 5 MG/ML IJ SOLN: 5.0000 mg | Freq: Three times a day (TID) | INTRAMUSCULAR | Status: DC | PRN

## 2014-02-26 MED ORDER — PROPOFOL 10 MG/ML IV BOLUS
INTRAVENOUS | Status: DC | PRN
  Administered 2014-02-26: 200 mg via INTRAVENOUS

## 2014-02-26 MED ORDER — PHENYLEPHRINE HCL 10 MG/ML IJ SOLN
INTRAMUSCULAR | Status: DC | PRN
  Administered 2014-02-26: 80 ug via INTRAVENOUS

## 2014-02-26 MED ORDER — FENTANYL CITRATE 0.05 MG/ML IJ SOLN
INTRAMUSCULAR | Status: AC
  Filled 2014-02-26: qty 5

## 2014-02-26 MED ORDER — FOLIC ACID 1 MG PO TABS
1.0000 mg | ORAL_TABLET | Freq: Every day | ORAL | Status: DC
  Filled 2014-02-26: qty 1

## 2014-02-26 MED ORDER — DIPHENHYDRAMINE HCL 12.5 MG/5ML PO ELIX
25.0000 mg | ORAL_SOLUTION | ORAL | Status: DC | PRN
  Administered 2014-02-27 – 2014-03-02 (×3): 25 mg via ORAL
  Filled 2014-02-26 (×3): qty 10

## 2014-02-26 MED ORDER — MORPHINE SULFATE 2 MG/ML IJ SOLN
1.0000 mg | INTRAMUSCULAR | Status: DC | PRN
  Administered 2014-02-26 – 2014-02-27 (×5): 1 mg via INTRAVENOUS
  Filled 2014-02-26 (×6): qty 1

## 2014-02-26 MED ORDER — ONDANSETRON HCL 4 MG/2ML IJ SOLN
INTRAMUSCULAR | Status: AC
  Filled 2014-02-26: qty 2

## 2014-02-26 MED ORDER — SORBITOL 70 % SOLN: 30.0000 mL | Freq: Every day | Status: DC | PRN

## 2014-02-26 MED ORDER — ONDANSETRON HCL 4 MG/2ML IJ SOLN: 4.0000 mg | Freq: Four times a day (QID) | INTRAMUSCULAR | Status: DC | PRN

## 2014-02-26 MED ORDER — SUCCINYLCHOLINE CHLORIDE 20 MG/ML IJ SOLN
INTRAMUSCULAR | Status: DC | PRN
  Administered 2014-02-26: 100 mg via INTRAVENOUS

## 2014-02-26 MED ORDER — ONDANSETRON HCL 4 MG/2ML IJ SOLN: 4.0000 mg | Freq: Once | INTRAMUSCULAR | Status: DC | PRN

## 2014-02-26 MED ORDER — ONDANSETRON HCL 4 MG PO TABS: 4.0000 mg | ORAL_TABLET | Freq: Four times a day (QID) | ORAL | Status: DC | PRN

## 2014-02-26 MED ORDER — OXYCODONE HCL 5 MG PO TABS: 5.0000 mg | ORAL_TABLET | Freq: Once | ORAL | Status: DC | PRN

## 2014-02-26 MED ORDER — SODIUM CHLORIDE 0.9 % IV SOLN
INTRAVENOUS | Status: DC
  Administered 2014-02-26 – 2014-02-28 (×2): via INTRAVENOUS

## 2014-02-26 MED ORDER — THIAMINE HCL 100 MG/ML IJ SOLN
100.0000 mg | Freq: Every day | INTRAMUSCULAR | Status: DC
  Filled 2014-02-26: qty 1

## 2014-02-26 MED ORDER — METHOCARBAMOL 1000 MG/10ML IJ SOLN
500.0000 mg | Freq: Four times a day (QID) | INTRAVENOUS | Status: DC | PRN
  Filled 2014-02-26: qty 5

## 2014-02-26 MED ORDER — LORAZEPAM 1 MG PO TABS: 1.0000 mg | ORAL_TABLET | Freq: Four times a day (QID) | ORAL | Status: DC | PRN

## 2014-02-26 MED ORDER — PNEUMOCOCCAL VAC POLYVALENT 25 MCG/0.5ML IJ INJ
0.5000 mL | INJECTION | INTRAMUSCULAR | Status: DC
  Filled 2014-02-26: qty 0.5

## 2014-02-26 MED ORDER — MIDAZOLAM HCL 2 MG/2ML IJ SOLN
INTRAMUSCULAR | Status: AC
  Filled 2014-02-26: qty 2

## 2014-02-26 MED ORDER — ENOXAPARIN SODIUM 40 MG/0.4ML ~~LOC~~ SOLN
40.0000 mg | SUBCUTANEOUS | Status: DC
  Administered 2014-02-28: 40 mg via SUBCUTANEOUS
  Filled 2014-02-26 (×3): qty 0.4

## 2014-02-26 MED ORDER — SODIUM CHLORIDE 0.9 % IR SOLN
Status: DC | PRN
  Administered 2014-02-26: 1000 mL

## 2014-02-26 MED ORDER — MIDAZOLAM HCL 5 MG/5ML IJ SOLN
INTRAMUSCULAR | Status: DC | PRN
  Administered 2014-02-26: 1 mg via INTRAVENOUS

## 2014-02-26 MED ORDER — LACTATED RINGERS IV SOLN
INTRAVENOUS | Status: DC | PRN
  Administered 2014-02-26 (×2): via INTRAVENOUS

## 2014-02-26 MED ORDER — ENOXAPARIN SODIUM 40 MG/0.4ML ~~LOC~~ SOLN: 40.0000 mg | SUBCUTANEOUS | Status: DC

## 2014-02-26 MED ORDER — FENTANYL CITRATE 0.05 MG/ML IJ SOLN
INTRAMUSCULAR | Status: DC | PRN
Start: 2014-02-26 — End: 2014-02-26

## 2014-02-26 MED ORDER — OXYCODONE HCL 5 MG PO TABS
5.0000 mg | ORAL_TABLET | ORAL | Status: DC | PRN
  Administered 2014-02-26: 10 mg via ORAL
  Administered 2014-02-27 – 2014-03-01 (×9): 15 mg via ORAL
  Filled 2014-02-26: qty 3
  Filled 2014-02-26: qty 2
  Filled 2014-02-26 (×8): qty 3

## 2014-02-26 MED ORDER — BACITRACIN ZINC 500 UNIT/GM EX OINT
TOPICAL_OINTMENT | Freq: Two times a day (BID) | CUTANEOUS | Status: DC
  Administered 2014-02-26 – 2014-02-28 (×3): via TOPICAL
  Administered 2014-02-28: 1 via TOPICAL
  Administered 2014-03-01 – 2014-03-04 (×5): via TOPICAL
  Filled 2014-02-26: qty 28.35

## 2014-02-26 MED ORDER — CEFAZOLIN SODIUM 1-5 GM-% IV SOLN
1.0000 g | Freq: Three times a day (TID) | INTRAVENOUS | Status: DC
  Administered 2014-02-26: 1 g via INTRAVENOUS
  Filled 2014-02-26 (×3): qty 50

## 2014-02-26 MED ORDER — METHOCARBAMOL 500 MG PO TABS
500.0000 mg | ORAL_TABLET | Freq: Four times a day (QID) | ORAL | Status: DC | PRN
  Administered 2014-02-26 – 2014-03-01 (×6): 500 mg via ORAL
  Filled 2014-02-26 (×5): qty 1

## 2014-02-26 MED ORDER — HYDROMORPHONE HCL PF 1 MG/ML IJ SOLN
0.2500 mg | INTRAMUSCULAR | Status: DC | PRN
  Administered 2014-02-26 (×4): 0.5 mg via INTRAVENOUS

## 2014-02-26 MED ORDER — METOCLOPRAMIDE HCL 5 MG PO TABS
5.0000 mg | ORAL_TABLET | Freq: Three times a day (TID) | ORAL | Status: DC | PRN
  Filled 2014-02-26: qty 2

## 2014-02-26 MED ORDER — LIDOCAINE HCL (CARDIAC) 20 MG/ML IV SOLN
INTRAVENOUS | Status: DC | PRN
  Administered 2014-02-26: 100 mg via INTRAVENOUS

## 2014-02-26 MED ORDER — CEFAZOLIN SODIUM-DEXTROSE 2-3 GM-% IV SOLR
2.0000 g | Freq: Four times a day (QID) | INTRAVENOUS | Status: AC
  Administered 2014-02-26 – 2014-02-27 (×3): 2 g via INTRAVENOUS
  Filled 2014-02-26 (×3): qty 50

## 2014-02-26 MED ORDER — SENNA 8.6 MG PO TABS
1.0000 | ORAL_TABLET | Freq: Two times a day (BID) | ORAL | Status: DC
  Administered 2014-02-26 – 2014-03-04 (×8): 8.6 mg via ORAL
  Filled 2014-02-26 (×13): qty 1

## 2014-02-26 MED ORDER — OXYCODONE HCL 5 MG/5ML PO SOLN: 5.0000 mg | Freq: Once | ORAL | Status: DC | PRN

## 2014-02-26 SURGICAL SUPPLY — 77 items
BANDAGE ELASTIC 3 VELCRO ST LF (GAUZE/BANDAGES/DRESSINGS) IMPLANT
BAR 400MM (Trauma) ×2 IMPLANT
BAR EXFIX SM BONE 10.5X400 (Trauma) ×4 IMPLANT
BIT DRILL 3.5 SHOFT HALF PIN (BIT) ×3 IMPLANT
BLADE SURG 10 STRL SS (BLADE) ×3 IMPLANT
BNDG COHESIVE 1X5 TAN STRL LF (GAUZE/BANDAGES/DRESSINGS) IMPLANT
BNDG COHESIVE 4X5 TAN STRL (GAUZE/BANDAGES/DRESSINGS) ×3 IMPLANT
BNDG COHESIVE 6X5 TAN STRL LF (GAUZE/BANDAGES/DRESSINGS) ×6 IMPLANT
BNDG CONFORM 3 STRL LF (GAUZE/BANDAGES/DRESSINGS) IMPLANT
BNDG GAUZE ELAST 4 BULKY (GAUZE/BANDAGES/DRESSINGS) ×3 IMPLANT
BNDG GAUZE STRTCH 6 (GAUZE/BANDAGES/DRESSINGS) ×9 IMPLANT
CLAMP BAR TO BAR (Clamp) ×6 IMPLANT
CLAMP PIN TO BAR (Clamp) ×12 IMPLANT
CORDS BIPOLAR (ELECTRODE) IMPLANT
COVER SURGICAL LIGHT HANDLE (MISCELLANEOUS) ×3 IMPLANT
CUFF TOURNIQUET SINGLE 24IN (TOURNIQUET CUFF) IMPLANT
CUFF TOURNIQUET SINGLE 34IN LL (TOURNIQUET CUFF) ×6 IMPLANT
CUFF TOURNIQUET SINGLE 44IN (TOURNIQUET CUFF) IMPLANT
DRAPE C-ARM 42X72 X-RAY (DRAPES) ×3 IMPLANT
DRAPE EXTREMITY BILATERAL (DRAPE) IMPLANT
DRAPE IMP U-DRAPE 54X76 (DRAPES) IMPLANT
DRAPE INCISE IOBAN 66X45 STRL (DRAPES) ×12 IMPLANT
DRAPE ORTHO SPLIT 77X108 STRL (DRAPES) ×4
DRAPE SURG 17X23 STRL (DRAPES) IMPLANT
DRAPE SURG ORHT 6 SPLT 77X108 (DRAPES) ×2 IMPLANT
DRAPE U-SHAPE 47X51 STRL (DRAPES) ×3 IMPLANT
DRSG MEPILEX BORDER 4X4 (GAUZE/BANDAGES/DRESSINGS) ×3 IMPLANT
DURAPREP 26ML APPLICATOR (WOUND CARE) ×3 IMPLANT
ELECT CAUTERY BLADE 6.4 (BLADE) ×3 IMPLANT
ELECT REM PT RETURN 9FT ADLT (ELECTROSURGICAL)
ELECTRODE REM PT RTRN 9FT ADLT (ELECTROSURGICAL) IMPLANT
FACESHIELD WRAPAROUND (MASK) IMPLANT
GAUZE SPONGE 4X4 12PLY STRL (GAUZE/BANDAGES/DRESSINGS) ×6 IMPLANT
GAUZE XEROFORM 1X8 LF (GAUZE/BANDAGES/DRESSINGS) ×3 IMPLANT
GAUZE XEROFORM 5X9 LF (GAUZE/BANDAGES/DRESSINGS) ×3 IMPLANT
GLOVE BIO SURGEON STRL SZ8 (GLOVE) ×3 IMPLANT
GLOVE ORTHO TXT STRL SZ7.5 (GLOVE) ×3 IMPLANT
GLOVE SURG SYN 7.5  E (GLOVE) ×4
GLOVE SURG SYN 7.5 E (GLOVE) ×2 IMPLANT
GOWN STRL REIN XL XLG (GOWN DISPOSABLE) ×3 IMPLANT
GOWN STRL REUS W/ TWL LRG LVL3 (GOWN DISPOSABLE) ×1 IMPLANT
GOWN STRL REUS W/ TWL XL LVL3 (GOWN DISPOSABLE) ×4 IMPLANT
GOWN STRL REUS W/TWL LRG LVL3 (GOWN DISPOSABLE) ×2
GOWN STRL REUS W/TWL XL LVL3 (GOWN DISPOSABLE) ×8
HANDPIECE INTERPULSE COAX TIP (DISPOSABLE)
KIT BASIN OR (CUSTOM PROCEDURE TRAY) ×3 IMPLANT
KIT ROOM TURNOVER OR (KITS) ×3 IMPLANT
MANIFOLD NEPTUNE II (INSTRUMENTS) ×3 IMPLANT
NS IRRIG 1000ML POUR BTL (IV SOLUTION) ×6 IMPLANT
PACK ORTHO EXTREMITY (CUSTOM PROCEDURE TRAY) ×3 IMPLANT
PAD ABD 8X10 STRL (GAUZE/BANDAGES/DRESSINGS) ×3 IMPLANT
PAD ARMBOARD 7.5X6 YLW CONV (MISCELLANEOUS) ×6 IMPLANT
PADDING CAST ABS 4INX4YD NS (CAST SUPPLIES) ×4
PADDING CAST ABS COTTON 4X4 ST (CAST SUPPLIES) ×2 IMPLANT
PADDING CAST COTTON 6X4 STRL (CAST SUPPLIES) ×3 IMPLANT
PIN HALF 5X40 (EXFIX) ×12 IMPLANT
SET HNDPC FAN SPRY TIP SCT (DISPOSABLE) IMPLANT
SPONGE LAP 18X18 X RAY DECT (DISPOSABLE) ×6 IMPLANT
STOCKINETTE IMPERVIOUS 9X36 MD (GAUZE/BANDAGES/DRESSINGS) ×3 IMPLANT
SUT ETHILON 2 0 FS 18 (SUTURE) ×9 IMPLANT
SUT ETHILON 2 0 PSLX (SUTURE) ×3 IMPLANT
SUT ETHILON 3 0 PS 1 (SUTURE) ×6 IMPLANT
SUT VIC AB 2-0 CT1 27 (SUTURE)
SUT VIC AB 2-0 CT1 36 (SUTURE) ×3 IMPLANT
SUT VIC AB 2-0 CT1 TAPERPNT 27 (SUTURE) IMPLANT
SUT VIC AB 2-0 FS1 27 (SUTURE) ×6 IMPLANT
SYR CONTROL 10ML LL (SYRINGE) IMPLANT
TOWEL OR 17X24 6PK STRL BLUE (TOWEL DISPOSABLE) ×3 IMPLANT
TOWEL OR 17X26 10 PK STRL BLUE (TOWEL DISPOSABLE) ×3 IMPLANT
TUBE ANAEROBIC SPECIMEN COL (MISCELLANEOUS) IMPLANT
TUBE CONNECTING 12'X1/4 (SUCTIONS) ×1
TUBE CONNECTING 12X1/4 (SUCTIONS) ×2 IMPLANT
TUBE FEEDING 5FR 15 INCH (TUBING) IMPLANT
TUBING CYSTO DISP (UROLOGICAL SUPPLIES) ×3 IMPLANT
UNDERPAD 30X30 INCONTINENT (UNDERPADS AND DIAPERS) ×6 IMPLANT
WATER STERILE IRR 1000ML POUR (IV SOLUTION) ×3 IMPLANT
YANKAUER SUCT BULB TIP NO VENT (SUCTIONS) ×3 IMPLANT

## 2014-02-26 NOTE — ED Notes (Signed)
Continues to try to sit up in bed, took off his c collar.  Replaced it.  Told him to go ahead and void as urinal is in place.

## 2014-02-26 NOTE — Op Note (Signed)
   Date of Surgery: 02/26/2014  INDICATIONS: Mr. Danny Hansen is a 58 y.o.-year-old male who sustained a type 1 open distal femur fracture; he was indicated for external fixation due to the displaced and unstable nature of the fracture and came to the operating room today for this procedure. The patient did consent to the procedure after discussion of the risks and benefits.   PREOPERATIVE DIAGNOSIS: left type 1 open distal femur fracture   POSTOPERATIVE DIAGNOSIS: Same.  PROCEDURE: External fixation left distal femur fracture CPT 20690 uniplane  SURGEON: N. Glee ArvinMichael Xu, M.D.  ANESTHESIA: general  IV FLUIDS AND URINE: See anesthesia.  ESTIMATED BLOOD LOSS: minimal mL.  IMPLANTS: [employer]  DRAINS: None.  COMPLICATIONS: None.  DESCRIPTION OF PROCEDURE: The patient was identified in the preoperative holding area.  The operative site was marked by the surgeon and confirmed by the patient.  He was brought back to the operating room.  Anesthesia was induced by the anesthesia team.  A well padded nonsterile tourniquet was placed. The operative extremity was prepped and draped in standard sterile fashion.  A timeout was performed.  Preoperative antibiotics were given.  The bony landmarks were palpated and the pin sites were marked on the skin.  Each Schanz pin was placed in the same fashion -- first drilling with the 3.5 mm drill while copiously irrigating, then hand placing the pin.  This was confirmed on x-ray on both views.  The ex-fix clamps were placed onto pins and the fracture was pulled into the proper alignment.  The clamps were completely tightened.   Final x-rays were taken in AP and lateral views to confirm the reduction and pin lengths. The wounds were cleaned and dried a final time and a sterile dressing consisting of Xeroform and kerlix was placed. Of note, the patient's compartments remained soft throughout the procedure.  The patient was then transferred back to the bed and left the  operating room in stable condition.  All sponge and instrument counts were correct.  POSTOPERATIVE PLAN: Mr. Danny Hansen will remain non weight bearing with the leg elevated.  he will return to the operating room for definitive fixation when the swelling has gone down.  Mr. Danny Hansen will receive DVT prophylaxis based on other medications, activity level, and risk ratio of bleeding to thrombosis.  Pin site care will be initiated on postoperative day one.  I will consult Dr. Carola FrostHandy for definitive treatment of the his acetabulum and distal femur fractures.  Mayra ReelN. Michael Xu, MD [facility] [phone] 2:16 PM
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
## 2014-02-26 NOTE — ED Notes (Signed)
Pt unable to give information regarding pain as he has received pain medications and has an elevated ETOH level.

## 2014-02-26 NOTE — ED Notes (Signed)
Pt awakened, top half of body lifted on the bed.  Stated he had to "pee."  Urinal remains between legs, pt encouraged to void.

## 2014-02-26 NOTE — Anesthesia Procedure Notes (Signed)
Procedure Name: Intubation Date/Time: 02/26/2014 1:13 PM Performed by: Darcey NoraJAMES, Shirleyann Montero B Pre-anesthesia Checklist: Patient identified, Emergency Drugs available, Suction available and Patient being monitored Patient Re-evaluated:Patient Re-evaluated prior to inductionOxygen Delivery Method: Circle system utilized Preoxygenation: Pre-oxygenation with 100% oxygen Intubation Type: IV induction Ventilation: Mask ventilation without difficulty Laryngoscope Size: Mac and 4 Grade View: Grade I Tube type: Oral Tube size: 7.5 mm Number of attempts: 1 Airway Equipment and Method: Stylet Placement Confirmation: ETT inserted through vocal cords under direct vision,  breath sounds checked- equal and bilateral and positive ETCO2 Secured at: 22 (cm at teeth) cm Tube secured with: Tape Dental Injury: Teeth and Oropharynx as per pre-operative assessment
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
## 2014-02-26 NOTE — Anesthesia Postprocedure Evaluation (Signed)
  Anesthesia Post-op Note  Patient: Danny Hansen  Procedure(s) Performed: Procedure(s): EXTERNAL FIXATION LEG (Left) IRRIGATION AND DEBRIDEMENT EXTREMITY (Left)  Patient Location: PACU  Anesthesia Type: General   Level of Consciousness: awake, alert  and oriented  Airway and Oxygen Therapy: Patient Spontanous Breathing  Post-op Pain: moderate  Post-op Assessment: Post-op Vital signs reviewed  Post-op Vital Signs: Reviewed  Last Vitals:  Filed Vitals:   02/26/14 1500  BP: 148/95  Pulse: 111  Temp:   Resp: 17    Complications: No apparent anesthesia complications

## 2014-02-26 NOTE — Transfer of Care (Signed)
Immediate Anesthesia Transfer of Care Note  Patient: Danny Hansen  Procedure(s) Performed: Procedure(s): EXTERNAL FIXATION LEG (Left) IRRIGATION AND DEBRIDEMENT EXTREMITY (Left)  Patient Location: PACU  Anesthesia Type:General  Level of Consciousness: awake, alert  and patient cooperative  Airway & Oxygen Therapy: Patient Spontanous Breathing and Patient connected to nasal cannula oxygen  Post-op Assessment: Report given to PACU RN, Post -op Vital signs reviewed and stable and Patient moving all extremities  Post vital signs: Reviewed and stable  Complications: No apparent anesthesia complications
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
## 2014-02-26 NOTE — ED Notes (Signed)
Report to receiving RN on 5th floor.

## 2014-02-26 NOTE — H&P (Signed)
H&P update  The surgical history has been reviewed and remains accurate without interval change.  The patient was re-examined and patient's physiologic condition has not changed significantly in the last 30 days. The condition still exists that makes this procedure necessary. The treatment plan remains the same, without new options for care.  No new pharmacological allergies or types of therapy has been initiated that would change the plan or the appropriateness of the plan.  The patient and/or family understand the potential benefits and risks.  Mayra ReelN. Michael Xu, MD 02/26/2014 11:19 AM

## 2014-02-26 NOTE — Anesthesia Preprocedure Evaluation (Addendum)
Anesthesia Evaluation  Patient identified by MRN, date of birth, ID band Patient awake    Reviewed: Allergy & Precautions, H&P , NPO status , Patient's Chart, lab work & pertinent test results  Airway Mallampati: I TM Distance: >3 FB Neck ROM: Full    Dental  (+) Poor Dentition, Dental Advisory Given, Edentulous Upper   Pulmonary Current Smoker,  breath sounds clear to auscultation        Cardiovascular hypertension, Pt. on medications Rhythm:Regular     Neuro/Psych    GI/Hepatic (+) Hepatitis -  Endo/Other    Renal/GU      Musculoskeletal   Abdominal   Peds  Hematology   Anesthesia Other Findings   Reproductive/Obstetrics                         Anesthesia Physical Anesthesia Plan  ASA: III  Anesthesia Plan: General   Post-op Pain Management:    Induction: Intravenous  Airway Management Planned: LMA  Additional Equipment:   Intra-op Plan:   Post-operative Plan: Extubation in OR  Informed Consent: I have reviewed the patients History and Physical, chart, labs and discussed the procedure including the risks, benefits and alternatives for the proposed anesthesia with the patient or authorized representative who has indicated his/her understanding and acceptance.   Dental advisory given  Plan Discussed with: CRNA, Anesthesiologist and Surgeon  Anesthesia Plan Comments:         Anesthesia Quick Evaluation
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
## 2014-02-26 NOTE — Progress Notes (Signed)
Patient ID: Danny Hansen, male   DOB: 1956/06/15, 58 y.o.   MRN: [id number] Given his neck clinical exam and CT scan, I did remove [name]'s cervical collar.

## 2014-02-26 NOTE — Progress Notes (Signed)
Orthopedic Tech Progress Note Patient Details:  Georjean Modearnest Lee Schlesinger [birth year] [id number]  Ortho Devices Type of Ortho Device: Ace wrap;Stirrup splint;Post (long leg) splint Ortho Device/Splint Location: put ohf on bed Ortho Device/Splint Interventions: Ordered;Application   Jennye MoccasinHughes, Chyanna Flock Craig 02/26/2014, 8:23 PM

## 2014-02-26 NOTE — Progress Notes (Signed)
Utilization review completed.  

## 2014-02-27 ENCOUNTER — Encounter (HOSPITAL_COMMUNITY): Admission: EM | Disposition: A | Discharge status: Rehabilitation Facility | Payer: Self-pay | Source: Home / Self Care

## 2014-02-27 ENCOUNTER — Encounter (HOSPITAL_COMMUNITY): Payer: Self-pay | Admitting: Orthopaedic Surgery

## 2014-02-27 ENCOUNTER — Encounter (HOSPITAL_COMMUNITY): Payer: No Typology Code available for payment source | Admitting: Anesthesiology

## 2014-02-27 ENCOUNTER — Inpatient Hospital Stay (HOSPITAL_COMMUNITY): Payer: No Typology Code available for payment source | Admitting: Anesthesiology

## 2014-02-27 ENCOUNTER — Ambulatory Visit: Payer: No Typology Code available for payment source | Admitting: Radiation Oncology

## 2014-02-27 DIAGNOSIS — S3710XA Unspecified injury of ureter, initial encounter: Secondary | ICD-10-CM | POA: Diagnosis present

## 2014-02-27 DIAGNOSIS — D62 Acute posthemorrhagic anemia: Secondary | ICD-10-CM

## 2014-02-27 HISTORY — PX: ORIF ACETABULAR FRACTURE: SHX5029

## 2014-02-27 LAB — BASIC METABOLIC PANEL
ANION GAP: 10 (ref 5–15)
BUN: 11 mg/dL (ref 6–23)
CALCIUM: 7.6 mg/dL — AB (ref 8.4–10.5)
CO2: 25 mEq/L (ref 19–32)
CREATININE: 0.69 mg/dL (ref 0.50–1.35)
Chloride: 100 mEq/L (ref 96–112)
GFR calc Af Amer: >90 mL/min (ref >90)
Glucose, Bld: 129 mg/dL — ABNORMAL HIGH (ref 70–99)
Potassium: 3.8 mEq/L (ref 3.7–5.3)
Sodium: 135 mEq/L — ABNORMAL LOW (ref 137–147)

## 2014-02-27 LAB — CBC
HCT: 23.5 % — ABNORMAL LOW (ref 39.0–52.0)
Hemoglobin: 8.1 g/dL — ABNORMAL LOW (ref 13.0–17.0)
MCH: 35.4 pg — AB (ref 26.0–34.0)
MCHC: 34.5 g/dL (ref 30.0–36.0)
MCV: 102.6 fL — ABNORMAL HIGH (ref 78.0–100.0)
PLATELETS: 113 10*3/uL — AB (ref 150–400)
RBC: 2.29 MIL/uL — AB (ref 4.22–5.81)
RDW: 12.7 % (ref 11.5–15.5)
WBC: 10.1 10*3/uL (ref 4.0–10.5)

## 2014-02-27 LAB — LACTIC ACID, PLASMA: Lactic Acid, Venous: 2.1 mmol/L (ref 0.5–2.2)

## 2014-02-27 LAB — HEPATITIS PANEL, ACUTE
HCV AB: REACTIVE — AB
Hep A IgM: NONREACTIVE
Hep B C IgM: NONREACTIVE
Hepatitis B Surface Ag: NEGATIVE

## 2014-02-27 LAB — HEPATIC FUNCTION PANEL
ALK PHOS: 66 U/L (ref 39–117)
ALT: 97 U/L — ABNORMAL HIGH (ref 0–53)
AST: 98 U/L — AB (ref 0–37)
Albumin: 2.9 g/dL — ABNORMAL LOW (ref 3.5–5.2)
BILIRUBIN DIRECT: 0.5 mg/dL — AB (ref 0.0–0.3)
BILIRUBIN INDIRECT: 0.8 mg/dL (ref 0.3–0.9)
TOTAL PROTEIN: 7.2 g/dL (ref 6.0–8.3)
Total Bilirubin: 1.3 mg/dL — ABNORMAL HIGH (ref 0.3–1.2)

## 2014-02-27 LAB — PROTIME-INR
INR: 1.14 (ref 0.00–1.49)
PROTHROMBIN TIME: 14.6 s (ref 11.6–15.2)

## 2014-02-27 LAB — AMMONIA: AMMONIA: 42 umol/L (ref 11–60)

## 2014-02-27 LAB — AMYLASE: Amylase: 95 U/L (ref 0–105)

## 2014-02-27 LAB — PREPARE RBC (CROSSMATCH)

## 2014-02-27 LAB — HIV ANTIBODY (ROUTINE TESTING W REFLEX): HIV 1&2 Ab, 4th Generation: NONREACTIVE

## 2014-02-27 LAB — APTT: APTT: 30 s (ref 24–37)

## 2014-02-27 LAB — LIPASE, BLOOD: Lipase: 34 U/L (ref 11–59)

## 2014-02-27 SURGERY — OPEN REDUCTION INTERNAL FIXATION (ORIF) ACETABULAR FRACTURE
Anesthesia: General | Laterality: Right

## 2014-02-27 MED ORDER — LIDOCAINE HCL (CARDIAC) 20 MG/ML IV SOLN
INTRAVENOUS | Status: DC | PRN
  Administered 2014-02-27: 60 mg via INTRAVENOUS

## 2014-02-27 MED ORDER — MIDAZOLAM HCL 5 MG/5ML IJ SOLN
INTRAMUSCULAR | Status: DC | PRN
  Administered 2014-02-27: 2 mg via INTRAVENOUS

## 2014-02-27 MED ORDER — FENTANYL CITRATE 0.05 MG/ML IJ SOLN
INTRAMUSCULAR | Status: AC
  Filled 2014-02-27: qty 5

## 2014-02-27 MED ORDER — ROCURONIUM BROMIDE 100 MG/10ML IV SOLN
INTRAVENOUS | Status: DC | PRN
  Administered 2014-02-27: 30 mg via INTRAVENOUS

## 2014-02-27 MED ORDER — LACTATED RINGERS IV SOLN
INTRAVENOUS | Status: DC | PRN
  Administered 2014-02-27: 14:00:00 via INTRAVENOUS

## 2014-02-27 MED ORDER — FENTANYL CITRATE 0.05 MG/ML IJ SOLN
INTRAMUSCULAR | Status: DC | PRN
  Administered 2014-02-27: 50 ug via INTRAVENOUS
  Administered 2014-02-27: 100 ug via INTRAVENOUS

## 2014-02-27 MED ORDER — PROPOFOL 10 MG/ML IV BOLUS
INTRAVENOUS | Status: AC
  Filled 2014-02-27: qty 20

## 2014-02-27 MED ORDER — ONDANSETRON HCL 4 MG/2ML IJ SOLN
INTRAMUSCULAR | Status: DC | PRN
  Administered 2014-02-27: 4 mg via INTRAVENOUS

## 2014-02-27 MED ORDER — SUCCINYLCHOLINE CHLORIDE 20 MG/ML IJ SOLN
INTRAMUSCULAR | Status: DC | PRN
  Administered 2014-02-27: 100 mg via INTRAVENOUS

## 2014-02-27 MED ORDER — GLYCOPYRROLATE 0.2 MG/ML IJ SOLN
INTRAMUSCULAR | Status: DC | PRN
  Administered 2014-02-27: 0.4 mg via INTRAVENOUS

## 2014-02-27 MED ORDER — SODIUM CHLORIDE 0.9 % IV SOLN
Freq: Once | INTRAVENOUS | Status: AC
  Administered 2014-02-28: 16:00:00 via INTRAVENOUS

## 2014-02-27 MED ORDER — ONDANSETRON HCL 4 MG/2ML IJ SOLN
INTRAMUSCULAR | Status: AC
  Filled 2014-02-27: qty 2

## 2014-02-27 MED ORDER — FUROSEMIDE 10 MG/ML IJ SOLN: 20.0000 mg | Freq: Once | INTRAMUSCULAR | Status: DC

## 2014-02-27 MED ORDER — NEOSTIGMINE METHYLSULFATE 10 MG/10ML IV SOLN
INTRAVENOUS | Status: DC | PRN
  Administered 2014-02-27: 3 mg via INTRAVENOUS

## 2014-02-27 MED ORDER — MIDAZOLAM HCL 2 MG/2ML IJ SOLN
INTRAMUSCULAR | Status: AC
  Filled 2014-02-27: qty 2

## 2014-02-27 MED ORDER — ACETAMINOPHEN 325 MG PO TABS
650.0000 mg | ORAL_TABLET | Freq: Once | ORAL | Status: AC
  Administered 2014-02-28: 650 mg via ORAL
  Filled 2014-02-27: qty 2

## 2014-02-27 MED ORDER — LIDOCAINE HCL (CARDIAC) 20 MG/ML IV SOLN
INTRAVENOUS | Status: AC
  Filled 2014-02-27: qty 5

## 2014-02-27 MED ORDER — ROCURONIUM BROMIDE 50 MG/5ML IV SOLN
INTRAVENOUS | Status: AC
  Filled 2014-02-27: qty 1

## 2014-02-27 MED ORDER — LACTATED RINGERS IV SOLN: INTRAVENOUS | Status: DC

## 2014-02-27 MED ORDER — PROPOFOL 10 MG/ML IV BOLUS
INTRAVENOUS | Status: DC | PRN
  Administered 2014-02-27: 130 mg via INTRAVENOUS

## 2014-02-27 MED ORDER — MIDAZOLAM HCL 2 MG/2ML IJ SOLN
INTRAMUSCULAR | Status: AC
Start: 2014-02-27 — End: 2014-02-27
  Filled 2014-02-27: qty 2

## 2014-02-27 SURGICAL SUPPLY — 63 items
APPLIER CLIP 11 MED OPEN (CLIP)
BLADE SURG ROTATE 9660 (MISCELLANEOUS) IMPLANT
BRUSH SCRUB DISP (MISCELLANEOUS) ×6 IMPLANT
CLIP APPLIE 11 MED OPEN (CLIP) IMPLANT
CLOSURE WOUND 1/2 X4 (GAUZE/BANDAGES/DRESSINGS)
COVER SURGICAL LIGHT HANDLE (MISCELLANEOUS) ×3 IMPLANT
DRAIN CHANNEL 10F 3/8 F FF (DRAIN) IMPLANT
DRAIN CHANNEL 15F RND FF W/TCR (WOUND CARE) IMPLANT
DRAPE C-ARM 42X72 X-RAY (DRAPES) ×3 IMPLANT
DRAPE C-ARMOR (DRAPES) IMPLANT
DRAPE INCISE IOBAN 66X45 STRL (DRAPES) ×3 IMPLANT
DRAPE INCISE IOBAN 85X60 (DRAPES) ×3 IMPLANT
DRAPE ORTHO SPLIT 77X108 STRL (DRAPES) ×4
DRAPE SURG ORHT 6 SPLT 77X108 (DRAPES) ×2 IMPLANT
DRAPE U-SHAPE 47X51 STRL (DRAPES) ×3 IMPLANT
DRSG ADAPTIC 3X8 NADH LF (GAUZE/BANDAGES/DRESSINGS) IMPLANT
DRSG PAD ABDOMINAL 8X10 ST (GAUZE/BANDAGES/DRESSINGS) IMPLANT
ELECT BLADE 6.5 EXT (BLADE) IMPLANT
ELECT REM PT RETURN 9FT ADLT (ELECTROSURGICAL) ×3
ELECTRODE REM PT RTRN 9FT ADLT (ELECTROSURGICAL) ×1 IMPLANT
EVACUATOR 1/8 PVC DRAIN (DRAIN) IMPLANT
EVACUATOR SILICONE 100CC (DRAIN) IMPLANT
GAUZE SPONGE 4X4 12PLY STRL (GAUZE/BANDAGES/DRESSINGS) IMPLANT
GAUZE SPONGE 4X4 16PLY XRAY LF (GAUZE/BANDAGES/DRESSINGS) IMPLANT
GLOVE BIO SURGEON STRL SZ7.5 (GLOVE) ×3 IMPLANT
GLOVE BIO SURGEON STRL SZ8 (GLOVE) ×3 IMPLANT
GLOVE BIOGEL PI IND STRL 7.5 (GLOVE) ×1 IMPLANT
GLOVE BIOGEL PI IND STRL 8 (GLOVE) ×1 IMPLANT
GLOVE BIOGEL PI INDICATOR 7.5 (GLOVE) ×2
GLOVE BIOGEL PI INDICATOR 8 (GLOVE) ×2
GOWN STRL REUS W/ TWL LRG LVL3 (GOWN DISPOSABLE) ×2 IMPLANT
GOWN STRL REUS W/ TWL XL LVL3 (GOWN DISPOSABLE) ×1 IMPLANT
GOWN STRL REUS W/TWL 2XL LVL3 (GOWN DISPOSABLE) IMPLANT
GOWN STRL REUS W/TWL LRG LVL3 (GOWN DISPOSABLE) ×4
GOWN STRL REUS W/TWL XL LVL3 (GOWN DISPOSABLE) ×2
HANDPIECE INTERPULSE COAX TIP (DISPOSABLE)
KIT BASIN OR (CUSTOM PROCEDURE TRAY) ×3 IMPLANT
KIT ROOM TURNOVER OR (KITS) ×3 IMPLANT
LIGHT ORTHO (MISCELLANEOUS) IMPLANT
LOOP VESSEL MAXI BLUE (MISCELLANEOUS) IMPLANT
MANIFOLD NEPTUNE II (INSTRUMENTS) IMPLANT
NEEDLE MAYO TROCAR (NEEDLE) ×3 IMPLANT
NS IRRIG 1000ML POUR BTL (IV SOLUTION) ×3 IMPLANT
PACK TOTAL JOINT (CUSTOM PROCEDURE TRAY) ×3 IMPLANT
PAD ARMBOARD 7.5X6 YLW CONV (MISCELLANEOUS) ×6 IMPLANT
RETRIEVER SUT HEWSON (MISCELLANEOUS) IMPLANT
SET HNDPC FAN SPRY TIP SCT (DISPOSABLE) IMPLANT
SPONGE LAP 18X18 X RAY DECT (DISPOSABLE) ×6 IMPLANT
STAPLER VISISTAT 35W (STAPLE) ×3 IMPLANT
STRIP CLOSURE SKIN 1/2X4 (GAUZE/BANDAGES/DRESSINGS) IMPLANT
SUCTION FRAZIER TIP 10 FR DISP (SUCTIONS) ×3 IMPLANT
SUT FIBERWIRE #2 38 T-5 BLUE (SUTURE) ×3
SUT VIC AB 0 CT1 27 (SUTURE) ×4
SUT VIC AB 0 CT1 27XBRD ANBCTR (SUTURE) ×2 IMPLANT
SUT VIC AB 1 CT1 18XCR BRD 8 (SUTURE) ×1 IMPLANT
SUT VIC AB 1 CT1 8-18 (SUTURE) ×2
SUT VIC AB 2-0 CT1 27 (SUTURE) ×4
SUT VIC AB 2-0 CT1 TAPERPNT 27 (SUTURE) ×2 IMPLANT
SUTURE FIBERWR #2 38 T-5 BLUE (SUTURE) ×1 IMPLANT
TOWEL OR 17X24 6PK STRL BLUE (TOWEL DISPOSABLE) ×3 IMPLANT
TOWEL OR 17X26 10 PK STRL BLUE (TOWEL DISPOSABLE) ×6 IMPLANT
TRAY FOLEY CATH 16FRSI W/METER (SET/KITS/TRAYS/PACK) IMPLANT
WATER STERILE IRR 1000ML POUR (IV SOLUTION) IMPLANT

## 2014-02-27 NOTE — OR Nursing (Signed)
Dr. Isabel CapriceGrapey at bedside to see pt for insertion of foley catheter.

## 2014-02-27 NOTE — Anesthesia Postprocedure Evaluation (Signed)
  Anesthesia Post-op Note  Patient: Danny Hansen  Procedure(s) Performed: Procedure(s): OPEN REDUCTION INTERNAL FIXATION (ORIF) RIGHT ACETABULAR FRACTURE (Right)  Patient Location: PACU  Anesthesia Type:General  Level of Consciousness: awake and alert   Airway and Oxygen Therapy: Patient Spontanous Breathing  Post-op Pain: none  Post-op Assessment: Post-op Vital signs reviewed, Patient's Cardiovascular Status Stable and Respiratory Function Stable  Post-op Vital Signs: Reviewed  Filed Vitals:   02/27/14 1615  BP:   Pulse: 101  Temp:   Resp: 7    Complications: No apparent anesthesia complications

## 2014-02-27 NOTE — Progress Notes (Signed)
Patient ID: Danny Hansen, male   DOB: 1956/03/16, 58 y.o.   MRN: [id number]   LOS: 2 days   Subjective: C/o throbbing in left thigh. Denies N/V, dysuria.   Objective: Vital signs in last 24 hours: Temp:  [97.9 F (36.6 C)-99.3 F (37.4 C)] 97.9 F (36.6 C) (08/20 0511) Pulse Rate:  [109-131] 127 (08/20 0511) Resp:  [12-25] 24 (08/20 0511) BP: (111-156)/(74-98) 156/83 mmHg (08/20 0511) SpO2:  [93 %-100 %] 94 % (08/20 0511)    Laboratory  CBC  Recent Labs  02/26/14 0350 02/27/14 0446  WBC 9.9 10.1  HGB 11.6* 8.1*  HCT 33.6* 23.5*  PLT 150 113*   BMET  Recent Labs  02/26/14 0350 02/27/14 0446  NA 141 135*  K 4.0 3.8  CL 104 100  CO2 20 25  GLUCOSE 154* 129*  BUN 11 11  CREATININE 0.66 0.69  CALCIUM 8.5 7.6*    Physical Exam General appearance: alert and no distress Resp: clear to auscultation bilaterally Cardio: Tachycardia GI: normal findings: bowel sounds normal and soft, non-tender Extremities: NVI, ex fix in place   Assessment/Plan: [facility]  Right periureteral stranding -- Renal fxn stable Right acet fx -- Dr. Carola FrostHandy to OR today. Watch for ileus.  Open left femur fx s/p I&D, ex fix -- On appropriate prophylactic abx.  Abd laceration -- Superficial, local care  ABL anemia -- Will transfuse 2 units PRBC's in advance of OR Daily EtOH use -- CIWA protocol  FEN -- NPO for OR VTE -- SCD's, Lovenox Dispo -- OR    Freeman CaldronMichael J. Glorie Dowlen, PA-C Pager: [phone] General Trauma PA Pager: [phone]  02/27/2014

## 2014-02-27 NOTE — Progress Notes (Signed)
Phoned this morning and spoke with Merita NortonAdrianne, RN caring for this patient [address]. Explained this patient is scheduled for radiation treatment to operative site tomorrow. Requested she contact [facility] (provided her with the number), provide reports, and request they [facility]. She verbalized understanding. [facility] to give him a heads up that she would be calling and the patient need to be delivered Friday at 1030. He verbalized understanding.

## 2014-02-27 NOTE — OR Nursing (Addendum)
Case cancelled per Dr. Inda MerlinMicheal Handy due to unavailability of urologist and inability to safely place foley catheter.

## 2014-02-27 NOTE — Progress Notes (Signed)
Abdomen NT.Transfuse 2u PRBC now. To OR with Dr. Carola FrostHandy for acetabulum. L femur will be done at a later date. Patient examined and I agree with the assessment and plan  Violeta GelinasBurke Abrielle Finck, MD, MPH, FACS Trauma: [phone] General Surgery: [phone]  02/27/2014 10:40 AM

## 2014-02-27 NOTE — Progress Notes (Signed)
Pt with order to insert foley catheter for perioperative use for surgical procedure scheduled for today(02/27/2014). Pt has refused insertion of foley catheter throughout PM shift. Will pass on to day shift RN.

## 2014-02-27 NOTE — Progress Notes (Signed)
   Subjective:  Patient reports pain as moderate.  No events.  Objective:   VITALS:   Filed Vitals:   02/26/14 1616 02/26/14 2109 02/27/14 0139 02/27/14 0511  BP: 137/98 155/86 149/84 156/83  Pulse: 131 125 121 127  Temp: 98.4 F (36.9 C) 98.6 F (37 C) 98.3 F (36.8 C) 97.9 F (36.6 C)  TempSrc: Oral     Resp: 20 22 22 24   Height:      Weight:      SpO2: 100% 100% 93% 94%    Neurologically intact Neurovascular intact Sensation intact distally Intact pulses distally Dorsiflexion/Plantar flexion intact Incision: dressing C/D/I and no drainage No cellulitis present Compartment soft Pin sites c/d/i   Lab Results  Component Value Date   WBC 10.1 02/27/2014   HGB 8.1* 02/27/2014   HCT 23.5* 02/27/2014   MCV 102.6* 02/27/2014   PLT 113* 02/27/2014     Assessment/Plan:  1 Day Post-Op   - Expected postop acute blood loss anemia - will monitor for symptoms - DVT ppx - SCDs, ambulation, lovenox - NWB bilateral lower extremity - Pain control - Dr. Carola FrostHandy to definitively treat his right acetabular and left distal femur fx - appreciate their assistance  Cheral AlmasXu, Naiping Michael 02/27/2014, 7:48 AM [phone]
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
## 2014-02-27 NOTE — Transfer of Care (Signed)
Immediate Anesthesia Transfer of Care Note  Patient: Danny Hansen  Procedure(s) Performed: Procedure(s): OPEN REDUCTION INTERNAL FIXATION (ORIF) RIGHT ACETABULAR FRACTURE (Right)  Patient Location: PACU  Anesthesia Type:General  Level of Consciousness: sedated, patient cooperative and responds to stimulation  Airway & Oxygen Therapy: Patient Spontanous Breathing and Patient connected to nasal cannula oxygen  Post-op Assessment: Report given to PACU RN, Post -op Vital signs reviewed and stable and Patient moving all extremities  Post vital signs: Reviewed and stable  Complications: No apparent anesthesia complications

## 2014-02-27 NOTE — Anesthesia Preprocedure Evaluation (Signed)
Anesthesia Evaluation  Patient identified by MRN, date of birth, ID band Patient awake    Reviewed: Allergy & Precautions, H&P , NPO status , Patient's Chart, lab work & pertinent test results  Airway Mallampati: I TM Distance: >3 FB Neck ROM: Full    Dental  (+) Poor Dentition, Dental Advisory Given, Edentulous Upper   Pulmonary Current Smoker,  breath sounds clear to auscultation        Cardiovascular hypertension, Pt. on medications negative cardio ROS  Rhythm:Regular     Neuro/Psych PSYCHIATRIC DISORDERS negative neurological ROS     GI/Hepatic negative GI ROS, (+)     substance abuse  alcohol use, Hepatitis -, C  Endo/Other  negative endocrine ROS  Renal/GU negative Renal ROS     Musculoskeletal negative musculoskeletal ROS (+)   Abdominal   Peds  Hematology  (+) anemia ,   Anesthesia Other Findings   Reproductive/Obstetrics negative OB ROS                           Anesthesia Physical  Anesthesia Plan  ASA: III  Anesthesia Plan: General   Post-op Pain Management:    Induction: Intravenous  Airway Management Planned: LMA  Additional Equipment:   Intra-op Plan:   Post-operative Plan: Extubation in OR  Informed Consent: I have reviewed the patients History and Physical, chart, labs and discussed the procedure including the risks, benefits and alternatives for the proposed anesthesia with the patient or authorized representative who has indicated his/her understanding and acceptance.   Dental advisory given  Plan Discussed with: CRNA, Anesthesiologist and Surgeon  Anesthesia Plan Comments:         Anesthesia Quick Evaluation

## 2014-02-27 NOTE — OR Nursing (Signed)
Mr. Adriana SimasCook received from OR, sedate, on oxygen via [employer], LLE in ext fix with 2nd unit of ordered PRBCs infusing with about 75% of unit left to infuse.  Will continue infusion of blood.  OR case cancelled for inability to place urinary catheter.  Urology consulted.

## 2014-02-28 ENCOUNTER — Encounter (HOSPITAL_COMMUNITY): Payer: Self-pay | Admitting: Anesthesiology

## 2014-02-28 ENCOUNTER — Ambulatory Visit: Payer: No Typology Code available for payment source | Admitting: Radiation Oncology

## 2014-02-28 ENCOUNTER — Encounter: Payer: Self-pay | Admitting: Radiation Oncology

## 2014-02-28 ENCOUNTER — Encounter (HOSPITAL_COMMUNITY): Admission: EM | Disposition: A | Discharge status: Rehabilitation Facility | Payer: Self-pay | Source: Home / Self Care

## 2014-02-28 ENCOUNTER — Encounter: Payer: Self-pay | Admitting: *Deleted

## 2014-02-28 DIAGNOSIS — N35919 Unspecified urethral stricture, male, unspecified site: Secondary | ICD-10-CM | POA: Diagnosis present

## 2014-02-28 DIAGNOSIS — B1920 Unspecified viral hepatitis C without hepatic coma: Secondary | ICD-10-CM

## 2014-02-28 DIAGNOSIS — F1410 Cocaine abuse, uncomplicated: Secondary | ICD-10-CM

## 2014-02-28 LAB — HCV RNA QUANT RFLX ULTRA OR GENOTYP
HCV QUANT LOG: 6.18 {Log} — AB (ref <1.18)
HCV Quantitative: 1498894 IU/mL — ABNORMAL HIGH (ref <15)

## 2014-02-28 LAB — CBC
HCT: 26.4 % — ABNORMAL LOW (ref 39.0–52.0)
HEMOGLOBIN: 9.3 g/dL — AB (ref 13.0–17.0)
MCH: 33.9 pg (ref 26.0–34.0)
MCHC: 35.2 g/dL (ref 30.0–36.0)
MCV: 96.4 fL (ref 78.0–100.0)
Platelets: 92 10*3/uL — ABNORMAL LOW (ref 150–400)
RBC: 2.74 MIL/uL — ABNORMAL LOW (ref 4.22–5.81)
RDW: 17.2 % — AB (ref 11.5–15.5)
WBC: 9 10*3/uL (ref 4.0–10.5)

## 2014-02-28 LAB — BASIC METABOLIC PANEL
Anion gap: 8 (ref 5–15)
BUN: 8 mg/dL (ref 6–23)
CALCIUM: 7.8 mg/dL — AB (ref 8.4–10.5)
CHLORIDE: 97 meq/L (ref 96–112)
CO2: 29 mEq/L (ref 19–32)
CREATININE: 0.68 mg/dL (ref 0.50–1.35)
Glucose, Bld: 109 mg/dL — ABNORMAL HIGH (ref 70–99)
Potassium: 3.7 mEq/L (ref 3.7–5.3)
Sodium: 134 mEq/L — ABNORMAL LOW (ref 137–147)

## 2014-02-28 LAB — PREPARE RBC (CROSSMATCH)

## 2014-02-28 SURGERY — OPEN REDUCTION INTERNAL FIXATION (ORIF) ACETABULAR FRACTURE
Anesthesia: General | Laterality: Right

## 2014-02-28 MED ORDER — CEFAZOLIN SODIUM-DEXTROSE 2-3 GM-% IV SOLR
2.0000 g | INTRAVENOUS | Status: AC
  Administered 2014-03-01: 2 g via INTRAVENOUS
  Filled 2014-02-28: qty 50

## 2014-02-28 MED ORDER — ACETAMINOPHEN 325 MG PO TABS: 650.0000 mg | ORAL_TABLET | Freq: Once | ORAL | Status: DC

## 2014-02-28 MED ORDER — SODIUM CHLORIDE 0.9 % IV SOLN
Freq: Once | INTRAVENOUS | Status: AC
  Administered 2014-02-28: 08:00:00 via INTRAVENOUS

## 2014-02-28 SURGICAL SUPPLY — 63 items
APPLIER CLIP 11 MED OPEN (CLIP)
BLADE SURG ROTATE 9660 (MISCELLANEOUS) IMPLANT
BRUSH SCRUB DISP (MISCELLANEOUS) ×6 IMPLANT
CLIP APPLIE 11 MED OPEN (CLIP) IMPLANT
CLOSURE WOUND 1/2 X4 (GAUZE/BANDAGES/DRESSINGS) ×1
COVER SURGICAL LIGHT HANDLE (MISCELLANEOUS) ×3 IMPLANT
DRAIN CHANNEL 10F 3/8 F FF (DRAIN) IMPLANT
DRAIN CHANNEL 15F RND FF W/TCR (WOUND CARE) IMPLANT
DRAPE C-ARM 42X72 X-RAY (DRAPES) IMPLANT
DRAPE C-ARMOR (DRAPES) ×3 IMPLANT
DRAPE INCISE IOBAN 66X45 STRL (DRAPES) IMPLANT
DRAPE INCISE IOBAN 85X60 (DRAPES) ×6 IMPLANT
DRAPE ORTHO SPLIT 77X108 STRL (DRAPES) ×4
DRAPE SURG ORHT 6 SPLT 77X108 (DRAPES) ×2 IMPLANT
DRAPE U-SHAPE 47X51 STRL (DRAPES) ×3 IMPLANT
DRSG ADAPTIC 3X8 NADH LF (GAUZE/BANDAGES/DRESSINGS) ×3 IMPLANT
DRSG PAD ABDOMINAL 8X10 ST (GAUZE/BANDAGES/DRESSINGS) ×6 IMPLANT
ELECT BLADE 6.5 EXT (BLADE) IMPLANT
ELECT REM PT RETURN 9FT ADLT (ELECTROSURGICAL) ×3
ELECTRODE REM PT RTRN 9FT ADLT (ELECTROSURGICAL) ×1 IMPLANT
EVACUATOR 1/8 PVC DRAIN (DRAIN) IMPLANT
EVACUATOR SILICONE 100CC (DRAIN) ×3 IMPLANT
GAUZE SPONGE 4X4 12PLY STRL (GAUZE/BANDAGES/DRESSINGS) ×3 IMPLANT
GAUZE SPONGE 4X4 16PLY XRAY LF (GAUZE/BANDAGES/DRESSINGS) ×3 IMPLANT
GLOVE BIO SURGEON STRL SZ7.5 (GLOVE) ×3 IMPLANT
GLOVE BIO SURGEON STRL SZ8 (GLOVE) ×3 IMPLANT
GLOVE BIOGEL PI IND STRL 7.5 (GLOVE) ×1 IMPLANT
GLOVE BIOGEL PI IND STRL 8 (GLOVE) ×1 IMPLANT
GLOVE BIOGEL PI INDICATOR 7.5 (GLOVE) ×2
GLOVE BIOGEL PI INDICATOR 8 (GLOVE) ×2
GOWN STRL REUS W/ TWL LRG LVL3 (GOWN DISPOSABLE) ×2 IMPLANT
GOWN STRL REUS W/ TWL XL LVL3 (GOWN DISPOSABLE) ×1 IMPLANT
GOWN STRL REUS W/TWL 2XL LVL3 (GOWN DISPOSABLE) ×3 IMPLANT
GOWN STRL REUS W/TWL LRG LVL3 (GOWN DISPOSABLE) ×4
GOWN STRL REUS W/TWL XL LVL3 (GOWN DISPOSABLE) ×2
HANDPIECE INTERPULSE COAX TIP (DISPOSABLE)
KIT BASIN OR (CUSTOM PROCEDURE TRAY) ×3 IMPLANT
KIT ROOM TURNOVER OR (KITS) ×3 IMPLANT
LIGHT ORTHO (MISCELLANEOUS) IMPLANT
LOOP VESSEL MAXI BLUE (MISCELLANEOUS) IMPLANT
MANIFOLD NEPTUNE II (INSTRUMENTS) ×3 IMPLANT
NEEDLE MAYO TROCAR (NEEDLE) ×3 IMPLANT
NS IRRIG 1000ML POUR BTL (IV SOLUTION) ×3 IMPLANT
PACK TOTAL JOINT (CUSTOM PROCEDURE TRAY) ×3 IMPLANT
PAD ARMBOARD 7.5X6 YLW CONV (MISCELLANEOUS) ×6 IMPLANT
RETRIEVER SUT HEWSON (MISCELLANEOUS) IMPLANT
SET HNDPC FAN SPRY TIP SCT (DISPOSABLE) IMPLANT
SPONGE LAP 18X18 X RAY DECT (DISPOSABLE) ×9 IMPLANT
STAPLER VISISTAT 35W (STAPLE) ×3 IMPLANT
STRIP CLOSURE SKIN 1/2X4 (GAUZE/BANDAGES/DRESSINGS) ×2 IMPLANT
SUCTION FRAZIER TIP 10 FR DISP (SUCTIONS) ×3 IMPLANT
SUT FIBERWIRE #2 38 T-5 BLUE (SUTURE)
SUT VIC AB 0 CT1 27 (SUTURE) ×2
SUT VIC AB 0 CT1 27XBRD ANBCTR (SUTURE) ×1 IMPLANT
SUT VIC AB 1 CT1 18XCR BRD 8 (SUTURE) ×1 IMPLANT
SUT VIC AB 1 CT1 8-18 (SUTURE) ×2
SUT VIC AB 2-0 CT1 27 (SUTURE) ×2
SUT VIC AB 2-0 CT1 TAPERPNT 27 (SUTURE) ×1 IMPLANT
SUTURE FIBERWR #2 38 T-5 BLUE (SUTURE) IMPLANT
TOWEL OR 17X24 6PK STRL BLUE (TOWEL DISPOSABLE) ×3 IMPLANT
TOWEL OR 17X26 10 PK STRL BLUE (TOWEL DISPOSABLE) ×6 IMPLANT
TRAY FOLEY CATH 16FRSI W/METER (SET/KITS/TRAYS/PACK) IMPLANT
WATER STERILE IRR 1000ML POUR (IV SOLUTION) IMPLANT
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
## 2014-02-28 NOTE — Progress Notes (Signed)
This patient has been seen and I agree with the findings and treatment plan.  Kush Farabee O. Coe Angelos, III, MD, FACS [phone] (pager) [phone] (direct pager) Trauma Surgeon  

## 2014-02-28 NOTE — Progress Notes (Signed)
Spoke with Sam RN at [facility]. Updated her on the plan for OR today, and she informed me that XRTs are not performed on day of surgery. If Dr. Mickey FarberHandy/Keith PA-C could call [facility] [phone] with the plans for XRT beginning of next week, it'd be most appreciated. Nursing will continue to monitor.
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
## 2014-02-28 NOTE — Progress Notes (Signed)
Patient ID: Danny Hansen, male   DOB: 08/10/1955, 58 y.o.   MRN: [id number]   LOS: 3 days   Subjective: No new c/o. Had to have foley placed by urology yesterday 2/2 urethral stricture.   Objective: Vital signs in last 24 hours: Temp:  [95 F (35 C)-100 F (37.8 C)] 99.6 F (37.6 C) (08/21 0636) Pulse Rate:  [101-127] 101 (08/21 0636) Resp:  [6-18] 16 (08/21 0636) BP: (112-156)/(65-91) 134/72 mmHg (08/21 0636) SpO2:  [94 %-98 %] 94 % (08/21 0636) Last BM Date: 02/25/14 (hypoactive bowel sounds)   Laboratory  CBC  Recent Labs  02/27/14 0446 02/28/14 0500  WBC 10.1 9.0  HGB 8.1* 9.3*  HCT 23.5* 26.4*  PLT 113* 92*   BMET  Recent Labs  02/27/14 0446 02/28/14 0500  NA 135* 134*  K 3.8 3.7  CL 100 97  CO2 25 29  GLUCOSE 129* 109*  BUN 11 8  CREATININE 0.69 0.68  CALCIUM 7.6* 7.8*    Physical Exam General appearance: alert and no distress Resp: clear to auscultation bilaterally Cardio: Mild tachycardia GI: normal findings: bowel sounds normal and soft, non-tender Extremities: Warm   Assessment/Plan: [facility]  Right periureteral stranding -- Renal fxn stable, can stop daily renal fxn labs Right acet fx -- Dr. Carola FrostHandy to OR today. Watch for ileus.  Open left femur fx s/p I&D, ex fix -- On appropriate prophylactic abx. For fixation sometime next week. Abd laceration -- Superficial, local care  ABL anemia -- Marginal response to transfusion, to get 2 more units PRBC's Urethral stricture -- Foley to stay for 4-5 days per urology Daily EtOH use -- CIWA protocol  FEN -- NPO for OR  VTE -- SCD's, Lovenox  Dispo -- OR today. Will be non-weight bearing bilaterally, will likely need placement, long-term anticoagulation.    Freeman CaldronMichael J. Adrien Shankar, PA-C Pager: [phone] General Trauma PA Pager: [phone]  02/28/2014

## 2014-03-01 ENCOUNTER — Inpatient Hospital Stay (HOSPITAL_COMMUNITY): Payer: No Typology Code available for payment source

## 2014-03-01 ENCOUNTER — Encounter (HOSPITAL_COMMUNITY): Payer: No Typology Code available for payment source | Admitting: Anesthesiology

## 2014-03-01 ENCOUNTER — Inpatient Hospital Stay (HOSPITAL_COMMUNITY): Payer: No Typology Code available for payment source | Admitting: Anesthesiology

## 2014-03-01 ENCOUNTER — Encounter (HOSPITAL_COMMUNITY): Payer: Self-pay | Admitting: Anesthesiology

## 2014-03-01 ENCOUNTER — Encounter (HOSPITAL_COMMUNITY): Admission: EM | Disposition: A | Discharge status: Rehabilitation Facility | Payer: Self-pay | Source: Home / Self Care

## 2014-03-01 HISTORY — PX: ORIF FEMUR FRACTURE: SHX2119

## 2014-03-01 HISTORY — PX: ORIF ACETABULAR FRACTURE: SHX5029

## 2014-03-01 LAB — CBC
HCT: 29.1 % — ABNORMAL LOW (ref 39.0–52.0)
Hemoglobin: 10.6 g/dL — ABNORMAL LOW (ref 13.0–17.0)
MCH: 33.2 pg (ref 26.0–34.0)
MCHC: 36.4 g/dL — AB (ref 30.0–36.0)
MCV: 91.2 fL (ref 78.0–100.0)
Platelets: 97 10*3/uL — ABNORMAL LOW (ref 150–400)
RBC: 3.19 MIL/uL — ABNORMAL LOW (ref 4.22–5.81)
RDW: 15.9 % — AB (ref 11.5–15.5)
WBC: 9.4 10*3/uL (ref 4.0–10.5)

## 2014-03-01 LAB — GRAM STAIN: Gram Stain: NONE SEEN

## 2014-03-01 LAB — PREPARE RBC (CROSSMATCH)

## 2014-03-01 SURGERY — OPEN REDUCTION INTERNAL FIXATION (ORIF) ACETABULAR FRACTURE
Anesthesia: General | Site: Leg Upper | Laterality: Right

## 2014-03-01 MED ORDER — MIDAZOLAM HCL 5 MG/5ML IJ SOLN
INTRAMUSCULAR | Status: DC | PRN
  Administered 2014-03-01 (×2): 1 mg via INTRAVENOUS

## 2014-03-01 MED ORDER — SODIUM CHLORIDE 0.9 % IJ SOLN
INTRAMUSCULAR | Status: AC
  Filled 2014-03-01: qty 10

## 2014-03-01 MED ORDER — METHOCARBAMOL 1000 MG/10ML IJ SOLN
500.0000 mg | Freq: Four times a day (QID) | INTRAVENOUS | Status: DC | PRN
  Filled 2014-03-01: qty 10

## 2014-03-01 MED ORDER — OXYCODONE HCL 5 MG PO TABS
10.0000 mg | ORAL_TABLET | ORAL | Status: DC | PRN
  Administered 2014-03-01 – 2014-03-02 (×3): 20 mg via ORAL
  Administered 2014-03-03 (×2): 10 mg via ORAL
  Filled 2014-03-01: qty 2
  Filled 2014-03-01 (×3): qty 4
  Filled 2014-03-01 (×2): qty 2

## 2014-03-01 MED ORDER — SUFENTANIL CITRATE 50 MCG/ML IV SOLN
INTRAVENOUS | Status: AC
  Filled 2014-03-01: qty 1

## 2014-03-01 MED ORDER — TOBRAMYCIN SULFATE 1.2 G IJ SOLR
INTRAMUSCULAR | Status: DC | PRN
  Administered 2014-03-01: 1.2 g

## 2014-03-01 MED ORDER — VITAMIN B-1 100 MG PO TABS
100.0000 mg | ORAL_TABLET | Freq: Every day | ORAL | Status: DC
  Administered 2014-03-01 – 2014-03-04 (×4): 100 mg via ORAL
  Filled 2014-03-01 (×4): qty 1

## 2014-03-01 MED ORDER — ESMOLOL HCL 10 MG/ML IV SOLN
INTRAVENOUS | Status: AC
  Filled 2014-03-01: qty 10

## 2014-03-01 MED ORDER — HYDROMORPHONE HCL PF 1 MG/ML IJ SOLN
INTRAMUSCULAR | Status: AC
  Administered 2014-03-01: 0.5 mg via INTRAVENOUS
  Filled 2014-03-01: qty 1

## 2014-03-01 MED ORDER — METHOCARBAMOL 500 MG PO TABS
500.0000 mg | ORAL_TABLET | Freq: Four times a day (QID) | ORAL | Status: DC | PRN
  Administered 2014-03-01 – 2014-03-03 (×5): 500 mg via ORAL
  Filled 2014-03-01 (×6): qty 1

## 2014-03-01 MED ORDER — ONDANSETRON HCL 4 MG PO TABS: 4.0000 mg | ORAL_TABLET | Freq: Four times a day (QID) | ORAL | Status: DC | PRN

## 2014-03-01 MED ORDER — METHOCARBAMOL 500 MG PO TABS
ORAL_TABLET | ORAL | Status: AC
  Administered 2014-03-02: 500 mg
  Filled 2014-03-01: qty 1

## 2014-03-01 MED ORDER — TOBRAMYCIN SULFATE 1.2 G IJ SOLR
INTRAMUSCULAR | Status: AC
  Filled 2014-03-01: qty 1.2

## 2014-03-01 MED ORDER — LORAZEPAM 2 MG/ML IJ SOLN
1.0000 mg | Freq: Four times a day (QID) | INTRAMUSCULAR | Status: DC | PRN
  Administered 2014-03-01 (×2): 1 mg via INTRAVENOUS
  Filled 2014-03-01: qty 1

## 2014-03-01 MED ORDER — FOLIC ACID 1 MG PO TABS
1.0000 mg | ORAL_TABLET | Freq: Every day | ORAL | Status: DC
  Administered 2014-03-01 – 2014-03-04 (×4): 1 mg via ORAL
  Filled 2014-03-01 (×4): qty 1

## 2014-03-01 MED ORDER — ONDANSETRON HCL 4 MG/2ML IJ SOLN
INTRAMUSCULAR | Status: AC
  Filled 2014-03-01: qty 2

## 2014-03-01 MED ORDER — FERROUS SULFATE 325 (65 FE) MG PO TABS
325.0000 mg | ORAL_TABLET | Freq: Three times a day (TID) | ORAL | Status: DC
  Administered 2014-03-01 – 2014-03-04 (×6): 325 mg via ORAL
  Filled 2014-03-01 (×11): qty 1

## 2014-03-01 MED ORDER — ACETAMINOPHEN 325 MG PO TABS
650.0000 mg | ORAL_TABLET | Freq: Four times a day (QID) | ORAL | Status: DC | PRN
  Administered 2014-03-02 – 2014-03-03 (×2): 650 mg via ORAL
  Filled 2014-03-01 (×2): qty 2

## 2014-03-01 MED ORDER — NEOSTIGMINE METHYLSULFATE 10 MG/10ML IV SOLN
INTRAVENOUS | Status: DC | PRN
  Administered 2014-03-01: 5 mg via INTRAVENOUS

## 2014-03-01 MED ORDER — ESMOLOL HCL 10 MG/ML IV SOLN
INTRAVENOUS | Status: DC | PRN
  Administered 2014-03-01 (×3): 10 mg via INTRAVENOUS

## 2014-03-01 MED ORDER — METOCLOPRAMIDE HCL 10 MG PO TABS: 5.0000 mg | ORAL_TABLET | Freq: Three times a day (TID) | ORAL | Status: DC | PRN

## 2014-03-01 MED ORDER — WARFARIN VIDEO: Freq: Once | Status: DC

## 2014-03-01 MED ORDER — METOCLOPRAMIDE HCL 5 MG/ML IJ SOLN: 5.0000 mg | Freq: Three times a day (TID) | INTRAMUSCULAR | Status: DC | PRN

## 2014-03-01 MED ORDER — PHENYLEPHRINE HCL 10 MG/ML IJ SOLN
10.0000 mg | INTRAVENOUS | Status: DC | PRN
  Administered 2014-03-01: 40 ug/min via INTRAVENOUS

## 2014-03-01 MED ORDER — HYDROMORPHONE HCL PF 1 MG/ML IJ SOLN
1.0000 mg | INTRAMUSCULAR | Status: DC | PRN
  Administered 2014-03-01: 1 mg via INTRAVENOUS
  Administered 2014-03-02 (×4): 2 mg via INTRAVENOUS
  Filled 2014-03-01: qty 1
  Filled 2014-03-01 (×4): qty 2

## 2014-03-01 MED ORDER — SODIUM CHLORIDE 0.9 % IR SOLN
Status: DC | PRN
  Administered 2014-03-01: 3000 mL

## 2014-03-01 MED ORDER — ONDANSETRON HCL 4 MG/2ML IJ SOLN: 4.0000 mg | Freq: Four times a day (QID) | INTRAMUSCULAR | Status: DC | PRN

## 2014-03-01 MED ORDER — LIDOCAINE HCL (CARDIAC) 20 MG/ML IV SOLN
INTRAVENOUS | Status: AC
  Filled 2014-03-01: qty 5

## 2014-03-01 MED ORDER — THIAMINE HCL 100 MG/ML IJ SOLN
100.0000 mg | Freq: Every day | INTRAMUSCULAR | Status: DC
  Filled 2014-03-01 (×4): qty 1

## 2014-03-01 MED ORDER — PHENOL 1.4 % MT LIQD: 1.0000 | OROMUCOSAL | Status: DC | PRN

## 2014-03-01 MED ORDER — GLYCOPYRROLATE 0.2 MG/ML IJ SOLN
INTRAMUSCULAR | Status: AC
  Filled 2014-03-01: qty 2

## 2014-03-01 MED ORDER — FENTANYL CITRATE 0.05 MG/ML IJ SOLN
INTRAMUSCULAR | Status: AC
  Filled 2014-03-01: qty 5

## 2014-03-01 MED ORDER — HYDROMORPHONE HCL PF 1 MG/ML IJ SOLN
INTRAMUSCULAR | Status: AC
  Filled 2014-03-01: qty 1

## 2014-03-01 MED ORDER — ROCURONIUM BROMIDE 50 MG/5ML IV SOLN
INTRAVENOUS | Status: AC
  Filled 2014-03-01: qty 2

## 2014-03-01 MED ORDER — LACTATED RINGERS IV SOLN
INTRAVENOUS | Status: DC | PRN
  Administered 2014-03-01 (×4): via INTRAVENOUS

## 2014-03-01 MED ORDER — VANCOMYCIN HCL 500 MG IV SOLR
INTRAVENOUS | Status: AC
  Filled 2014-03-01: qty 500

## 2014-03-01 MED ORDER — OXYCODONE HCL 5 MG PO TABS
ORAL_TABLET | ORAL | Status: AC
  Filled 2014-03-01: qty 3

## 2014-03-01 MED ORDER — EPHEDRINE SULFATE 50 MG/ML IJ SOLN
INTRAMUSCULAR | Status: AC
  Filled 2014-03-01: qty 1

## 2014-03-01 MED ORDER — GLYCOPYRROLATE 0.2 MG/ML IJ SOLN
INTRAMUSCULAR | Status: DC | PRN
  Administered 2014-03-01: 0.6 mg via INTRAVENOUS

## 2014-03-01 MED ORDER — LABETALOL HCL 5 MG/ML IV SOLN
INTRAVENOUS | Status: DC | PRN
  Administered 2014-03-01: 10 mg via INTRAVENOUS

## 2014-03-01 MED ORDER — ONDANSETRON HCL 4 MG/2ML IJ SOLN
INTRAMUSCULAR | Status: DC | PRN
  Administered 2014-03-01: 4 mg via INTRAVENOUS

## 2014-03-01 MED ORDER — ROCURONIUM BROMIDE 100 MG/10ML IV SOLN
INTRAVENOUS | Status: DC | PRN
  Administered 2014-03-01: 20 mg via INTRAVENOUS
  Administered 2014-03-01 (×3): 10 mg via INTRAVENOUS
  Administered 2014-03-01: 20 mg via INTRAVENOUS
  Administered 2014-03-01: 10 mg via INTRAVENOUS
  Administered 2014-03-01: 50 mg via INTRAVENOUS
  Administered 2014-03-01 (×2): 10 mg via INTRAVENOUS

## 2014-03-01 MED ORDER — ENOXAPARIN SODIUM 30 MG/0.3ML ~~LOC~~ SOLN
30.0000 mg | Freq: Two times a day (BID) | SUBCUTANEOUS | Status: DC
  Administered 2014-03-02 – 2014-03-03 (×2): 30 mg via SUBCUTANEOUS
  Filled 2014-03-01 (×5): qty 0.3

## 2014-03-01 MED ORDER — ONDANSETRON HCL 4 MG/2ML IJ SOLN: 4.0000 mg | Freq: Once | INTRAMUSCULAR | Status: DC | PRN

## 2014-03-01 MED ORDER — LIDOCAINE HCL (CARDIAC) 20 MG/ML IV SOLN
INTRAVENOUS | Status: DC | PRN
  Administered 2014-03-01: 100 mg via INTRAVENOUS

## 2014-03-01 MED ORDER — 0.9 % SODIUM CHLORIDE (POUR BTL) OPTIME
TOPICAL | Status: DC | PRN
  Administered 2014-03-01: 1000 mL

## 2014-03-01 MED ORDER — NEOSTIGMINE METHYLSULFATE 10 MG/10ML IV SOLN
INTRAVENOUS | Status: AC
  Filled 2014-03-01: qty 1

## 2014-03-01 MED ORDER — LORAZEPAM 2 MG/ML IJ SOLN
INTRAMUSCULAR | Status: AC
Start: 2014-03-01 — End: 2014-03-02
  Filled 2014-03-01: qty 1

## 2014-03-01 MED ORDER — CEFAZOLIN SODIUM-DEXTROSE 2-3 GM-% IV SOLR
INTRAVENOUS | Status: AC
  Filled 2014-03-01: qty 50

## 2014-03-01 MED ORDER — ADULT MULTIVITAMIN W/MINERALS CH
1.0000 | ORAL_TABLET | Freq: Every day | ORAL | Status: DC
  Administered 2014-03-01 – 2014-03-04 (×4): 1 via ORAL
  Filled 2014-03-01 (×4): qty 1

## 2014-03-01 MED ORDER — CEFAZOLIN SODIUM 1-5 GM-% IV SOLN
1.0000 g | Freq: Three times a day (TID) | INTRAVENOUS | Status: AC
  Administered 2014-03-01 – 2014-03-02 (×2): 1 g via INTRAVENOUS
  Filled 2014-03-01 (×3): qty 50

## 2014-03-01 MED ORDER — MIDAZOLAM HCL 2 MG/2ML IJ SOLN
INTRAMUSCULAR | Status: AC
  Filled 2014-03-01: qty 2

## 2014-03-01 MED ORDER — DOCUSATE SODIUM 100 MG PO CAPS
100.0000 mg | ORAL_CAPSULE | Freq: Two times a day (BID) | ORAL | Status: DC
  Administered 2014-03-01 – 2014-03-04 (×6): 100 mg via ORAL
  Filled 2014-03-01 (×7): qty 1

## 2014-03-01 MED ORDER — VANCOMYCIN HCL 1000 MG IV SOLR
INTRAVENOUS | Status: DC | PRN
  Administered 2014-03-01: 1000 mg

## 2014-03-01 MED ORDER — ALBUMIN HUMAN 5 % IV SOLN
INTRAVENOUS | Status: DC | PRN
  Administered 2014-03-01: 09:00:00 via INTRAVENOUS

## 2014-03-01 MED ORDER — HYDROMORPHONE HCL PF 1 MG/ML IJ SOLN
0.2500 mg | INTRAMUSCULAR | Status: DC | PRN
  Administered 2014-03-01 (×4): 0.5 mg via INTRAVENOUS

## 2014-03-01 MED ORDER — FENTANYL CITRATE 0.05 MG/ML IJ SOLN
INTRAMUSCULAR | Status: DC | PRN
  Administered 2014-03-01 (×2): 100 ug via INTRAVENOUS
  Administered 2014-03-01 (×2): 50 ug via INTRAVENOUS
  Administered 2014-03-01: 100 ug via INTRAVENOUS

## 2014-03-01 MED ORDER — CEFAZOLIN SODIUM 1-5 GM-% IV SOLN
INTRAVENOUS | Status: DC | PRN
  Administered 2014-03-01 (×2): 1 g via INTRAVENOUS

## 2014-03-01 MED ORDER — WARFARIN SODIUM 5 MG PO TABS
5.0000 mg | ORAL_TABLET | Freq: Once | ORAL | Status: AC
  Administered 2014-03-01: 5 mg via ORAL
  Filled 2014-03-01: qty 1

## 2014-03-01 MED ORDER — LORAZEPAM 1 MG PO TABS
1.0000 mg | ORAL_TABLET | Freq: Four times a day (QID) | ORAL | Status: DC | PRN
  Administered 2014-03-02 – 2014-03-03 (×2): 1 mg via ORAL
  Filled 2014-03-01 (×3): qty 1

## 2014-03-01 MED ORDER — ACETAMINOPHEN 650 MG RE SUPP: 650.0000 mg | Freq: Four times a day (QID) | RECTAL | Status: DC | PRN

## 2014-03-01 MED ORDER — POLYETHYLENE GLYCOL 3350 17 G PO PACK
17.0000 g | PACK | Freq: Every day | ORAL | Status: DC
  Administered 2014-03-01 – 2014-03-04 (×4): 17 g via ORAL
  Filled 2014-03-01 (×4): qty 1

## 2014-03-01 MED ORDER — WARFARIN - PHARMACIST DOSING INPATIENT
Freq: Every day | Status: DC
  Administered 2014-03-03: 18:00:00

## 2014-03-01 MED ORDER — MENTHOL 3 MG MT LOZG: 1.0000 | LOZENGE | OROMUCOSAL | Status: DC | PRN

## 2014-03-01 MED ORDER — PROPOFOL 10 MG/ML IV BOLUS
INTRAVENOUS | Status: DC | PRN
  Administered 2014-03-01: 150 mg via INTRAVENOUS

## 2014-03-01 MED ORDER — PROPOFOL 10 MG/ML IV BOLUS
INTRAVENOUS | Status: AC
  Filled 2014-03-01: qty 20

## 2014-03-01 MED ORDER — SUFENTANIL CITRATE 50 MCG/ML IV SOLN
INTRAVENOUS | Status: DC | PRN
  Administered 2014-03-01 (×2): 10 ug via INTRAVENOUS
  Administered 2014-03-01 (×2): 20 ug via INTRAVENOUS
  Administered 2014-03-01 (×5): 10 ug via INTRAVENOUS
  Administered 2014-03-01: 20 ug via INTRAVENOUS
  Administered 2014-03-01 (×2): 10 ug via INTRAVENOUS

## 2014-03-01 SURGICAL SUPPLY — 112 items
APPLIER CLIP 11 MED OPEN (CLIP)
BANDAGE ELASTIC 4 VELCRO ST LF (GAUZE/BANDAGES/DRESSINGS) ×4 IMPLANT
BANDAGE ELASTIC 6 VELCRO ST LF (GAUZE/BANDAGES/DRESSINGS) ×4 IMPLANT
BIT DRILL AO MATTA 2.5MX230M (BIT) ×2 IMPLANT
BIT DRILL AO MATTA 3.2MX230M (BIT) ×4 IMPLANT
BIT DRILL CALIBRATED 3.2MM (DRILL) ×2 IMPLANT
BIT DRILL SCALD PELVS II 2.5MM (BIT) ×2 IMPLANT
BLADE SURG ROTATE 9660 (MISCELLANEOUS) ×8 IMPLANT
BNDG GAUZE ELAST 4 BULKY (GAUZE/BANDAGES/DRESSINGS) ×4 IMPLANT
BONE CEMENT PALACOSE (Orthopedic Implant) ×4 IMPLANT
BOWL SMART MIX CTS (DISPOSABLE) ×4 IMPLANT
BRUSH SCRUB DISP (MISCELLANEOUS) ×8 IMPLANT
CEMENT BONE PALACOSE (Orthopedic Implant) ×2 IMPLANT
CLEANER TIP ELECTROSURG 2X2 (MISCELLANEOUS) ×4 IMPLANT
CLIP APPLIE 11 MED OPEN (CLIP) IMPLANT
CLOSURE WOUND 1/2 X4 (GAUZE/BANDAGES/DRESSINGS) ×1
COVER SURGICAL LIGHT HANDLE (MISCELLANEOUS) ×4 IMPLANT
DRAIN CHANNEL 10F 3/8 F FF (DRAIN) IMPLANT
DRAIN CHANNEL 15F RND FF W/TCR (WOUND CARE) IMPLANT
DRAPE C-ARM 42X72 X-RAY (DRAPES) ×8 IMPLANT
DRAPE C-ARMOR (DRAPES) ×8 IMPLANT
DRAPE INCISE IOBAN 66X45 STRL (DRAPES) ×4 IMPLANT
DRAPE INCISE IOBAN 85X60 (DRAPES) ×4 IMPLANT
DRAPE ORTHO SPLIT 77X108 STRL (DRAPES) ×4
DRAPE SURG ORHT 6 SPLT 77X108 (DRAPES) ×4 IMPLANT
DRAPE U-SHAPE 47X51 STRL (DRAPES) ×8 IMPLANT
DRILL BIT AO MATTA 2.5MX230M (BIT) ×4
DRILL BIT AO MATTA 3.2MX230M (BIT) ×8
DRILL CALIBRATED 3.2MM (DRILL) ×4
DRILL SCALED PELVIS II 2.5MM (BIT) ×4
DRSG ADAPTIC 3X8 NADH LF (GAUZE/BANDAGES/DRESSINGS) ×4 IMPLANT
DRSG MEPILEX BORDER 4X12 (GAUZE/BANDAGES/DRESSINGS) ×4 IMPLANT
DRSG PAD ABDOMINAL 8X10 ST (GAUZE/BANDAGES/DRESSINGS) ×4 IMPLANT
ELECT BLADE 6.5 EXT (BLADE) ×4 IMPLANT
ELECT REM PT RETURN 9FT ADLT (ELECTROSURGICAL) ×4
ELECTRODE REM PT RTRN 9FT ADLT (ELECTROSURGICAL) ×2 IMPLANT
EVACUATOR 1/8 PVC DRAIN (DRAIN) IMPLANT
EVACUATOR SILICONE 100CC (DRAIN) ×4 IMPLANT
GAUZE SPONGE 4X4 12PLY STRL (GAUZE/BANDAGES/DRESSINGS) ×4 IMPLANT
GAUZE SPONGE 4X4 16PLY XRAY LF (GAUZE/BANDAGES/DRESSINGS) ×4 IMPLANT
GLOVE BIO SURGEON STRL SZ7.5 (GLOVE) IMPLANT
GLOVE BIO SURGEON STRL SZ8 (GLOVE) ×16 IMPLANT
GLOVE BIOGEL PI IND STRL 7.5 (GLOVE) IMPLANT
GLOVE BIOGEL PI IND STRL 8 (GLOVE) ×4 IMPLANT
GLOVE BIOGEL PI INDICATOR 7.5 (GLOVE)
GLOVE BIOGEL PI INDICATOR 8 (GLOVE) ×4
GOWN STRL REUS W/ TWL LRG LVL3 (GOWN DISPOSABLE) ×8 IMPLANT
GOWN STRL REUS W/ TWL XL LVL3 (GOWN DISPOSABLE) ×6 IMPLANT
GOWN STRL REUS W/TWL 2XL LVL3 (GOWN DISPOSABLE) IMPLANT
GOWN STRL REUS W/TWL LRG LVL3 (GOWN DISPOSABLE) ×8
GOWN STRL REUS W/TWL XL LVL3 (GOWN DISPOSABLE) ×6
HANDPIECE INTERPULSE COAX TIP (DISPOSABLE) ×4
IMMOBILIZER KNEE 22 UNIV (SOFTGOODS) ×4 IMPLANT
KIT BASIN OR (CUSTOM PROCEDURE TRAY) ×4 IMPLANT
KIT ROOM TURNOVER OR (KITS) ×4 IMPLANT
LIGHT ORTHO (MISCELLANEOUS) IMPLANT
LOOP VESSEL MAXI BLUE (MISCELLANEOUS) IMPLANT
MANIFOLD NEPTUNE II (INSTRUMENTS) ×4 IMPLANT
NEEDLE MAYO TROCAR (NEEDLE) ×4 IMPLANT
NS IRRIG 1000ML POUR BTL (IV SOLUTION) ×4 IMPLANT
PACK TOTAL JOINT (CUSTOM PROCEDURE TRAY) ×4 IMPLANT
PAD ARMBOARD 7.5X6 YLW CONV (MISCELLANEOUS) ×8 IMPLANT
PENCIL BUTTON HOLSTER BLD 10FT (ELECTRODE) ×4 IMPLANT
PIN APEX 200X6MM (Pin) ×4 IMPLANT
PLATE ACET STRT 70.5M 6H (Plate) ×4 IMPLANT
PLATE ACET STRT 94.5M 8H (Plate) ×4 IMPLANT
PLATE CONDYLAR LCP 8H RIGHT (Plate) ×4 IMPLANT
RETRIEVER SUT HEWSON (MISCELLANEOUS) ×4 IMPLANT
SCREW 3.5X46MM (Screw) ×4 IMPLANT
SCREW 7.3 CANN LK 85 (Screw) ×4 IMPLANT
SCREW CANN LOCK 5.0X75 (Screw) ×8 IMPLANT
SCREW CANN LOCK 5.0X85 (Screw) ×4 IMPLANT
SCREW CONICAL THREADED 7.3X85M (Screw) ×4 IMPLANT
SCREW CORTEX ST 4.5X38 (Screw) ×4 IMPLANT
SCREW CORTEX ST 4.5X40 (Screw) ×4 IMPLANT
SCREW CORTEX ST 4.5X42 (Screw) ×4 IMPLANT
SCREW CORTEX ST 4.5X60 (Screw) ×4 IMPLANT
SCREW CORTEX ST MATTA 3.5X22MM (Screw) ×4 IMPLANT
SCREW CORTEX ST MATTA 3.5X24 (Screw) ×4 IMPLANT
SCREW CORTEX ST MATTA 3.5X26MM (Screw) ×8 IMPLANT
SCREW CORTEX ST MATTA 3.5X30MM (Screw) ×4 IMPLANT
SCREW CORTEX ST MATTA 3.5X32MM (Screw) ×8 IMPLANT
SCREW CORTEX ST MATTA 3.5X38M (Screw) ×8 IMPLANT
SCREW CORTEX ST MATTA 3.5X75MM (Screw) ×4 IMPLANT
SCREW LOCKING 5.0MM 25MM (Screw) ×4 IMPLANT
SCREW LOCKING T25 4.0MM 38MM (Screw) ×4 IMPLANT
SCREW LOCKING T25 4.0MM 42MM (Screw) ×8 IMPLANT
SET HNDPC FAN SPRY TIP SCT (DISPOSABLE) ×4 IMPLANT
SPONGE GAUZE 4X4 12PLY STER LF (GAUZE/BANDAGES/DRESSINGS) ×4 IMPLANT
SPONGE LAP 18X18 X RAY DECT (DISPOSABLE) ×12 IMPLANT
STAPLER VISISTAT 35W (STAPLE) ×4 IMPLANT
STRIP CLOSURE SKIN 1/2X4 (GAUZE/BANDAGES/DRESSINGS) ×3 IMPLANT
SUCTION FRAZIER TIP 10 FR DISP (SUCTIONS) ×4 IMPLANT
SUT ETHILON 3 0 PS 1 (SUTURE) ×4 IMPLANT
SUT FIBERWIRE #2 38 T-5 BLUE (SUTURE) ×8
SUT PDS AB 0 CT 36 (SUTURE) ×4 IMPLANT
SUT PDS AB 2-0 CT1 27 (SUTURE) ×4 IMPLANT
SUT VIC AB 0 CT1 27 (SUTURE) ×4
SUT VIC AB 0 CT1 27XBRD ANBCTR (SUTURE) ×4 IMPLANT
SUT VIC AB 1 CT1 18XCR BRD 8 (SUTURE) ×2 IMPLANT
SUT VIC AB 1 CT1 8-18 (SUTURE) ×2
SUT VIC AB 2-0 CT1 27 (SUTURE) ×4
SUT VIC AB 2-0 CT1 TAPERPNT 27 (SUTURE) ×4 IMPLANT
SUTURE FIBERWR #2 38 T-5 BLUE (SUTURE) ×4 IMPLANT
TOWEL OR 17X24 6PK STRL BLUE (TOWEL DISPOSABLE) ×4 IMPLANT
TOWEL OR 17X26 10 PK STRL BLUE (TOWEL DISPOSABLE) ×8 IMPLANT
TRAY FOLEY CATH 16FRSI W/METER (SET/KITS/TRAYS/PACK) IMPLANT
TUBE CONNECTING 12'X1/4 (SUCTIONS) ×1
TUBE CONNECTING 12X1/4 (SUCTIONS) ×3 IMPLANT
WATER STERILE IRR 1000ML POUR (IV SOLUTION) IMPLANT
WIRE DRILL TIP 2.5 300MM (WIRE) ×20 IMPLANT
YANKAUER SUCT BULB TIP NO VENT (SUCTIONS) ×4 IMPLANT

## 2014-03-01 NOTE — Progress Notes (Signed)
I have seen and examined the patient. I agree with the findings above.  Budd Palmer, MD 03/01/2014 8:22 AM

## 2014-03-01 NOTE — Progress Notes (Signed)
After two cancelled trips to the OR; one for inability to place foley because of stricture and one for administration of anticoagulation on morning of surgery with asociated acute blood loss anemia, patient is well versed with risks and benefits of the procedure.  The advantage of return at this time is that femur fracture may be repaired as well given the resolution of soft tissue swelling.  His examination is unchanged.  He requests definitive repair.  I discussed with the patient the risks and benefits of surgery, including the possibility of infection, nerve injury, vessel injury, wound breakdown, arthritis, symptomatic hardware, DVT/ PE, loss of motion, and need for further surgery among others.  We also specifically discussed the elevated risk of infection from his open left femur.  He understood these risks and wished to proceed.  Myrene GalasMichael Eleni Frank, MD [facility] [phone] (p)

## 2014-03-01 NOTE — Anesthesia Preprocedure Evaluation (Signed)
Anesthesia Evaluation  Patient identified by MRN, date of birth, ID band Patient awake    Reviewed: Allergy & Precautions, H&P , NPO status , Patient's Chart, lab work & pertinent test results  Airway       Dental   Pulmonary Current Smoker,          Cardiovascular hypertension,     Neuro/Psych    GI/Hepatic (+)     substance abuse  alcohol use, Hepatitis -  Endo/Other    Renal/GU      Musculoskeletal   Abdominal   Peds  Hematology  (+) anemia ,   Anesthesia Other Findings   Reproductive/Obstetrics                           Anesthesia Physical Anesthesia Plan  ASA: III  Anesthesia Plan: General   Post-op Pain Management:    Induction: Intravenous  Airway Management Planned: Oral ETT  Additional Equipment:   Intra-op Plan:   Post-operative Plan: Extubation in OR and Possible Post-op intubation/ventilation  Informed Consent: I have reviewed the patients History and Physical, chart, labs and discussed the procedure including the risks, benefits and alternatives for the proposed anesthesia with the patient or authorized representative who has indicated his/her understanding and acceptance.     Plan Discussed with: Surgeon, Anesthesiologist and CRNA  Anesthesia Plan Comments:         Anesthesia Quick Evaluation

## 2014-03-01 NOTE — Progress Notes (Signed)
Order to restart CIWA protocol, per Dr. Carola FrostHandy

## 2014-03-01 NOTE — Progress Notes (Signed)
Orthopedic Tech Progress Note Patient Details:  Danny Hansen 09-Nov-1955 [id number]  Ortho Devices Type of Ortho Device: Ace wrap;Stirrup splint;Post (long leg) splint Ortho Device/Splint Location: footsie roll at bedside Ortho Device/Splint Interventions: Ordered   Jennye MoccasinHughes, Octavion Mollenkopf Craig 03/01/2014, 10:00 PM

## 2014-03-01 NOTE — Anesthesia Procedure Notes (Signed)
Procedure Name: Intubation Date/Time: 03/01/2014 8:28 AM Performed by: Izora Gala Pre-anesthesia Checklist: Patient identified, Emergency Drugs available, Suction available and Patient being monitored Patient Re-evaluated:Patient Re-evaluated prior to inductionOxygen Delivery Method: Circle system utilized Preoxygenation: Pre-oxygenation with 100% oxygen Intubation Type: IV induction Laryngoscope Size: Miller and 3 Grade View: Grade I Tube size: 8.0 mm Number of attempts: 1 Airway Equipment and Method: Stylet and LTA kit utilized Placement Confirmation: ETT inserted through vocal cords under direct vision,  positive ETCO2 and breath sounds checked- equal and bilateral Secured at: 23 cm Tube secured with: Tape Dental Injury: Teeth and Oropharynx as per pre-operative assessment

## 2014-03-01 NOTE — Progress Notes (Signed)
Pt very restless. Pulled out both IVs and trying to pull out catheter. Ativan given once IV inserted. Pt disoriented and cussing when awake.
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
## 2014-03-01 NOTE — Anesthesia Postprocedure Evaluation (Signed)
  Anesthesia Post-op Note  Patient: Danny Hansen  Procedure(s) Performed: Procedure(s): OPEN REDUCTION INTERNAL FIXATION (ORIF) ACETABULAR FRACTURE (Right) OPEN REDUCTION INTERNAL FIXATION (ORIF) DISTAL FEMUR FRACTURE (Left)  Patient Location: PACU  Anesthesia Type:General  Level of Consciousness: awake, oriented, sedated and patient cooperative  Airway and Oxygen Therapy: Patient Spontanous Breathing  Post-op Pain: mild  Post-op Assessment: Post-op Vital signs reviewed, Patient's Cardiovascular Status Stable, Respiratory Function Stable, Patent Airway and No signs of Nausea or vomiting  Post-op Vital Signs: stable  Last Vitals:  Filed Vitals:   03/01/14 1656  BP: 153/90  Pulse: 93  Temp: 36.8 C  Resp: 16    Complications: No apparent anesthesia complications

## 2014-03-01 NOTE — Transfer of Care (Signed)
Immediate Anesthesia Transfer of Care Note  Patient: Danny Hansen  Procedure(s) Performed: Procedure(s): OPEN REDUCTION INTERNAL FIXATION (ORIF) ACETABULAR FRACTURE (Right) OPEN REDUCTION INTERNAL FIXATION (ORIF) DISTAL FEMUR FRACTURE (Left)  Patient Location: PACU  Anesthesia Type:General  Level of Consciousness: awake and alert   Airway & Oxygen Therapy: Patient Spontanous Breathing and Patient connected to face mask oxygen  Post-op Assessment: Report given to PACU RN and Post -op Vital signs reviewed and stable  Post vital signs: Reviewed and stable  Complications: No apparent anesthesia complications

## 2014-03-02 DIAGNOSIS — B182 Chronic viral hepatitis C: Secondary | ICD-10-CM | POA: Diagnosis present

## 2014-03-02 LAB — COMPREHENSIVE METABOLIC PANEL
ALT: 35 U/L (ref 0–53)
AST: 72 U/L — AB (ref 0–37)
Albumin: 2.3 g/dL — ABNORMAL LOW (ref 3.5–5.2)
Alkaline Phosphatase: 44 U/L (ref 39–117)
Anion gap: 9 (ref 5–15)
BILIRUBIN TOTAL: 1.3 mg/dL — AB (ref 0.3–1.2)
BUN: 10 mg/dL (ref 6–23)
CALCIUM: 8 mg/dL — AB (ref 8.4–10.5)
CHLORIDE: 98 meq/L (ref 96–112)
CO2: 29 meq/L (ref 19–32)
Creatinine, Ser: 0.64 mg/dL (ref 0.50–1.35)
GFR calc Af Amer: >90 mL/min (ref >90)
Glucose, Bld: 136 mg/dL — ABNORMAL HIGH (ref 70–99)
Potassium: 3.8 mEq/L (ref 3.7–5.3)
SODIUM: 136 meq/L — AB (ref 137–147)
Total Protein: 6 g/dL (ref 6.0–8.3)

## 2014-03-02 LAB — TYPE AND SCREEN
ABO/RH(D): O POS
ANTIBODY SCREEN: NEGATIVE
UNIT DIVISION: 0
UNIT DIVISION: 0
Unit division: 0
Unit division: 0
Unit division: 0
Unit division: 0

## 2014-03-02 LAB — POCT I-STAT 4, (NA,K, GLUC, HGB,HCT)
Glucose, Bld: 104 mg/dL — ABNORMAL HIGH (ref 70–99)
Glucose, Bld: 104 mg/dL — ABNORMAL HIGH (ref 70–99)
Glucose, Bld: 101 mg/dL — ABNORMAL HIGH (ref 70–99)
HCT: 27 % — ABNORMAL LOW (ref 39.0–52.0)
HCT: 30 % — ABNORMAL LOW (ref 39.0–52.0)
HEMATOCRIT: 28 % — AB (ref 39.0–52.0)
HEMOGLOBIN: 9.5 g/dL — AB (ref 13.0–17.0)
Hemoglobin: 9.2 g/dL — ABNORMAL LOW (ref 13.0–17.0)
Hemoglobin: 10.2 g/dL — ABNORMAL LOW (ref 13.0–17.0)
POTASSIUM: 3.9 meq/L (ref 3.7–5.3)
Potassium: 3.7 mEq/L (ref 3.7–5.3)
Potassium: 4.2 mEq/L (ref 3.7–5.3)
SODIUM: 135 meq/L — AB (ref 137–147)
Sodium: 135 mEq/L — ABNORMAL LOW (ref 137–147)
Sodium: 134 mEq/L — ABNORMAL LOW (ref 137–147)

## 2014-03-02 LAB — CBC
HCT: 25.9 % — ABNORMAL LOW (ref 39.0–52.0)
Hemoglobin: 9.1 g/dL — ABNORMAL LOW (ref 13.0–17.0)
MCH: 33.1 pg (ref 26.0–34.0)
MCHC: 35.1 g/dL (ref 30.0–36.0)
MCV: 94.2 fL (ref 78.0–100.0)
PLATELETS: 141 10*3/uL — AB (ref 150–400)
RBC: 2.75 MIL/uL — AB (ref 4.22–5.81)
RDW: 15.6 % — ABNORMAL HIGH (ref 11.5–15.5)
WBC: 8.7 10*3/uL (ref 4.0–10.5)

## 2014-03-02 LAB — PROTIME-INR
INR: 1.39 (ref 0.00–1.49)
PROTHROMBIN TIME: 17.1 s — AB (ref 11.6–15.2)

## 2014-03-02 MED ORDER — WARFARIN SODIUM 5 MG PO TABS
5.0000 mg | ORAL_TABLET | Freq: Once | ORAL | Status: AC
Start: 2014-03-02 — End: 2014-03-02
  Administered 2014-03-02: 5 mg via ORAL
  Filled 2014-03-02: qty 1
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
## 2014-03-02 NOTE — Progress Notes (Signed)
Rehab Admissions Coordinator Note:  Patient was screened by Clois Dupes for appropriateness for an Inpatient Acute Rehab Consult per PT recommendation. At this time, we are recommending Inpatient Rehab consult. Please place order.  Clois Dupes 03/02/2014, 6:08 PM  I can be reached at [phone].

## 2014-03-02 NOTE — Progress Notes (Signed)
Patient has pulled IV access out, is less agitated than earlier this morning.  PA made aware of pt condition, no new IV access needed.
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
## 2014-03-02 NOTE — Brief Op Note (Signed)
02/25/2014 - 03/01/2014  2:39 AM  PATIENT:  Danny Hansen  58 y.o. male  PRE-OPERATIVE DIAGNOSIS:   1. RIGHT TRANSVERSE POSTERIOR WALL ACETABULAR FRACTURE 2. LEFT SUPRACONDYLAR FEMUR FRACTURE WITH INTERCONDYLAR EXTENSION, OPEN 3. RETAINED EXTERNAL FIXATOR LEFT LEG  POST-OPERATIVE DIAGNOSIS:   1. RIGHT TRANSVERSE POSTERIOR WALL ACETABULAR FRACTURE 2. LEFT SUPRACONDYLAR FEMUR FRACTURE WITH INTERCONDYLAR EXTENSION, OPEN 3. RETAINED EXTERNAL FIXATOR LEFT LEG  PROCEDURE:  Procedure(s): 1. OPEN REDUCTION INTERNAL FIXATION (ORIF) OF RIGHT TRANSVERSE POSTERIOR WALL ACETABULAR FRACTURE 2. ORIF LEFT SUPRACONDYLAR FEMUR FRACTURE WITH INTERCONDYLAR EXTENSION 3. DEBRIDEMENT OF OPEN FRACTURE INCLUDING REMOVAL OF BONE 4. REMOVAL OF RETAINED EXTERNAL FIXATOR LEFT LEG 5. PLACEMENT OF ANTIBIOTIC CEMENT SPACER  SURGEON:  Surgeon(s) and Role:    * Budd Palmer, MD - Primary  PHYSICIAN ASSISTANT: RNFA  ANESTHESIA:   general  I/O:  Total I/O In: 360 [P.O.:360] Out: 625 [Urine:625]  SPECIMEN:  Source of Specimen:  Right supracondylar open fracture  TOURNIQUET:  * No tourniquets in log *  DICTATION: .Other Dictation: Dictation Number [id number]

## 2014-03-02 NOTE — Progress Notes (Signed)
Patient is very agitated this morning, refusing all medications.  Family is now visiting and will attempt to administer medications after they leave.

## 2014-03-02 NOTE — Evaluation (Signed)
Physical Therapy Evaluation Patient Details Name: Danny Hansen MRN: [id number] DOB: [birth year] Today's Date: 03/02/2014   History of Present Illness  58 y.o. s/p ORIF left acetabulum and ORIF Right distal femur.  Clinical Impression  Patient is seen following the above procedures and presents with functional limitations due to the deficits listed below (see PT Problem List). Able to tolerate anterior-posterior chair transfer from bed while maintaining LE precautions and weight bearing status with Mod assist +2. Plan to progress to sliding board transfers as patient is able. Patient will benefit from skilled PT to increase their independence and safety with mobility to allow discharge to the venue listed below.    Follow Up Recommendations CIR;Supervision/Assistance - 24 hour    Equipment Recommendations  3in1 (PT);Wheelchair (measurements PT);Wheelchair cushion (measurements PT) (sliding board; 3 in 1 with drop arm.)    Recommendations for Other Services Rehab consult;OT consult     Precautions / Restrictions Precautions Precautions: Posterior Hip;Fall Precaution Comments: NWB LLE, TDWB RLE (A/PROM to left knee allowed) Required Braces or Orthoses: Other Brace/Splint (bledsoe brace LLE.) Restrictions Weight Bearing Restrictions: Yes RLE Weight Bearing: Touchdown weight bearing LLE Weight Bearing: Non weight bearing      Mobility  Bed Mobility Overal bed mobility: Needs Assistance;+2 for physical assistance Bed Mobility: Supine to Sit     Supine to sit: Mod assist;+2 for safety/equipment;HOB elevated     General bed mobility comments: Mod assist for trunk control and forward weight shift to assume long sit position. Second person assist with LEs for safe positioning and support to maintain precautions. Needs assist with bed pad to turn hips but able to provide some assistance with use of UEs to lift bottom. Max cues for technique. Educated on ways to support his own LEs  while performing mobility.  Transfers Overall transfer level: Needs assistance Equipment used: None Transfers: Licensed conveyancer transfers: Mod assist;+2 safety/equipment   General transfer comment: Mod assist for trunk control however pt able to strongly push through UEs to scoot backwards into the chair. VC for technique. Requires assist for final positioning for upright posture in chair.  Ambulation/Gait                Stairs            Wheelchair Mobility    Modified Rankin (Stroke Patients Only)       Balance Overall balance assessment: Needs assistance Sitting-balance support: Bilateral upper extremity supported;Feet supported Sitting balance-Leahy Scale: Zero                                       Pertinent Vitals/Pain Pain Assessment: 0-10 Pain Score: 7  Pain Location: "My knee" Pain Intervention(s): Limited activity within patient's tolerance;Monitored during session;Repositioned    Home Living Family/patient expects to be discharged to:: Inpatient rehab Living Arrangements: Other relatives (sister) Available Help at Discharge: Family;Available PRN/intermittently Type of Home: Apartment         Home Equipment: None      Prior Function Level of Independence: Independent               Hand Dominance   Dominant Hand: Right    Extremity/Trunk Assessment               Lower Extremity Assessment: Difficult to assess due to impaired cognition         Communication  Communication: No difficulties  Cognition Arousal/Alertness: Lethargic;Suspect due to medications Behavior During Therapy: Restless;Impulsive Overall Cognitive Status: Impaired/Different from baseline Area of Impairment: Orientation;Attention;Memory;Following commands;Safety/judgement;Awareness;Problem solving Orientation Level: Disoriented to;Place;Situation Current Attention Level: Selective Memory:  Decreased recall of precautions Following Commands: Follows one step commands inconsistently;Follows one step commands with increased time Safety/Judgement: Decreased awareness of safety;Decreased awareness of deficits Awareness: Emergent;Anticipatory Problem Solving: Difficulty sequencing;Requires verbal cues;Requires tactile cues General Comments: Pt falls asleep easily but became more alert while performing transfer training.    General Comments General comments (skin integrity, edema, etc.): Pt easily falls asleep but willing and anxious to get OOB into chair. Limited LE assessment due to poor arousal and inability to sustain attention during evaluation. Positioned    Exercises General Exercises - Lower Extremity Ankle Circles/Pumps: AAROM;Both;10 reps;Seated      Assessment/Plan    PT Assessment Patient needs continued PT services  PT Diagnosis Difficulty walking;Generalized weakness;Acute pain;Altered mental status   PT Problem List Decreased strength;Decreased range of motion;Decreased activity tolerance;Decreased balance;Decreased mobility;Decreased cognition;Decreased knowledge of use of DME;Decreased safety awareness;Decreased knowledge of precautions;Pain  PT Treatment Interventions DME instruction;Functional mobility training;Therapeutic activities;Therapeutic exercise;Balance training;Neuromuscular re-education;Cognitive remediation;Patient/family education;Wheelchair mobility training;Modalities   PT Goals (Current goals can be found in the Care Plan section) Acute Rehab PT Goals Patient Stated Goal: None stated PT Goal Formulation: Patient unable to participate in goal setting Time For Goal Achievement: 03/09/14 Potential to Achieve Goals: Good    Frequency Min 5X/week   Barriers to discharge Decreased caregiver support pt lives at sisters house, will not have 24 hour care. Not safe to be cared for by non-health care trained individuals.    Co-evaluation                End of Session Equipment Utilized During Treatment: Other (comment) (bledsoe brace Lt) Activity Tolerance: Patient limited by fatigue;Patient limited by lethargy Patient left: in chair;with call bell/phone within reach;with chair alarm set Nurse Communication: Mobility status;Need for lift equipment;Weight bearing status         Time: 1547-1620 PT Time Calculation (min): 33 min   Charges:   PT Evaluation $Initial PT Evaluation Tier I: 1 Procedure PT Treatments $Therapeutic Activity: 8-22 mins $Self Care/Home Management: 8-22   PT G Codes:         [name], PT [phone]  [name] 03/02/2014, 5:23 PM

## 2014-03-03 ENCOUNTER — Encounter: Payer: Self-pay | Admitting: Radiation Oncology

## 2014-03-03 ENCOUNTER — Ambulatory Visit
Admit: 2014-03-03 | Discharge: 2014-03-03 | Disposition: A | Discharge status: Home / Self Care | Payer: No Typology Code available for payment source | Attending: Radiation Oncology | Admitting: Radiation Oncology

## 2014-03-03 VITALS — BP 113/75 | HR 100 | Temp 99.3°F | Resp 18 | Ht 66.0 in | Wt 145.0 lb

## 2014-03-03 DIAGNOSIS — M6140 Other calcification of muscle, unspecified site: Secondary | ICD-10-CM

## 2014-03-03 DIAGNOSIS — M9689 Other intraoperative and postprocedural complications and disorders of the musculoskeletal system: Principal | ICD-10-CM

## 2014-03-03 LAB — CBC
HEMATOCRIT: 23.5 % — AB (ref 39.0–52.0)
HEMOGLOBIN: 8.2 g/dL — AB (ref 13.0–17.0)
MCH: 32.3 pg (ref 26.0–34.0)
MCHC: 34.9 g/dL (ref 30.0–36.0)
MCV: 92.5 fL (ref 78.0–100.0)
Platelets: 147 10*3/uL — ABNORMAL LOW (ref 150–400)
RBC: 2.54 MIL/uL — ABNORMAL LOW (ref 4.22–5.81)
RDW: 15.2 % (ref 11.5–15.5)
WBC: 11.1 10*3/uL — AB (ref 4.0–10.5)

## 2014-03-03 LAB — PROTIME-INR
INR: 1.8 — AB (ref 0.00–1.49)
Prothrombin Time: 20.9 seconds — ABNORMAL HIGH (ref 11.6–15.2)

## 2014-03-03 MED ORDER — WARFARIN SODIUM 5 MG PO TABS
5.0000 mg | ORAL_TABLET | Freq: Once | ORAL | Status: DC
  Administered 2014-03-03: 5 mg via ORAL
  Filled 2014-03-03: qty 1

## 2014-03-03 MED ORDER — HYDROMORPHONE HCL PF 1 MG/ML IJ SOLN: 0.5000 mg | INTRAMUSCULAR | Status: DC | PRN

## 2014-03-03 MED ORDER — ENOXAPARIN SODIUM 40 MG/0.4ML ~~LOC~~ SOLN
40.0000 mg | SUBCUTANEOUS | Status: DC
  Filled 2014-03-03: qty 0.4

## 2014-03-03 MED ORDER — OXYCODONE HCL 5 MG PO TABS
10.0000 mg | ORAL_TABLET | ORAL | Status: DC | PRN
  Administered 2014-03-03 – 2014-03-04 (×4): 20 mg via ORAL
  Filled 2014-03-03 (×4): qty 4

## 2014-03-03 NOTE — Progress Notes (Signed)
Patient scheduled to received radiation therapy to right hip. Dry intact bandage noted to right hip. Patient seen and evaluated by Dr. Mitzi Hansen. Consent obtained by Dr. Mitzi Hansen from patient allowing treatment.

## 2014-03-03 NOTE — Progress Notes (Signed)
Pt's ciwa score is 4. He has been more oriented and less agitated this evening. His pain is being managed with po pain meds.

## 2014-03-03 NOTE — Progress Notes (Signed)
[facility] Radiation Oncology Dept Therapy Treatment Record Phone [phone]   Radiation Therapy was administered to Danny Hansen on: 03/03/2014  9:55 AM and was treatment # 1 out of a planned course of 1 treatments.

## 2014-03-03 NOTE — Progress Notes (Signed)
Received patient in exam room 1 from [facility] via [facility]. Patient drowsy. Reports pain in left knee when being transferred from stretcher to hospital bed. Vitals stable. Reports pain in left leg 2 on a scale of 0-10.

## 2014-03-03 NOTE — Progress Notes (Signed)
This patient has been seen and I agree with the findings and treatment plan.  Zera Markwardt O. Eron Goble, III, MD, FACS [phone] (pager) [phone] (direct pager) Trauma Surgeon  

## 2014-03-03 NOTE — Progress Notes (Signed)
Patient tore away several bandages from his incisions and wounds, and the RN found them on the floor. He stated that he did not want them on any longer.
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
## 2014-03-03 NOTE — Progress Notes (Signed)
Patient ID: Danny Hansen, male   DOB: [birth year], 58 y.o.   MRN: [id number]   LOS: 6 days   Subjective: Returned from XRT, no issues.   Objective: Vital signs in last 24 hours: Temp:  [99.3 F (37.4 C)-100.8 F (38.2 C)] 99.6 F (37.6 C) (08/24 1406) Pulse Rate:  [100-117] 101 (08/24 1406) Resp:  [16-20] 18 (08/24 1406) BP: (113-138)/(64-75) 138/67 mmHg (08/24 1406) SpO2:  [94 %-100 %] 94 % (08/24 1406) Weight:  [145 lb (65.772 kg)] 145 lb (65.772 kg) (08/24 0917) Last BM Date: 02/25/14   Laboratory  CBC  Recent Labs  03/02/14 0431 03/03/14 0515  WBC 8.7 11.1*  HGB 9.1* 8.2*  HCT 25.9* 23.5*  PLT 141* 147*   Lab Results  Component Value Date   INR 1.80* 03/03/2014   INR 1.39 03/02/2014   INR 1.14 02/27/2014    Physical Exam General appearance: alert and no distress Resp: clear to auscultation bilaterally Cardio: regular rate and rhythm GI: normal findings: bowel sounds normal and soft, non-tender Extremities: NVI   Assessment/Plan: [facility]  Right periureteral stranding -- Renal fxn stable, can stop daily renal fxn labs  Right acet fx s/p ORIF -- NWB Open left femur fx s/p I&D, ex fix, delayed ORIF -- NWB Abd laceration -- Superficial, local care  ABL anemia -- Continues to drift, monitor 

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
## 2014-03-03 NOTE — Progress Notes (Signed)
Patient ID: Danny Hansen, male   DOB: [birth year], 58 y.o.   MRN: [id number] 2 Days Post-Op  Subjective: Danny Hansen is now 2 days out from his orthopedic procedure.  He has had the foley since 8/20 when he had a urethral dilation.   He has been trying to pull the foley out over the last day or so.   Dr. Isabel Caprice wanted it to stay in for about 4 days so he should be ok for removal today.  ROS:  Review of Systems  Constitutional: Negative for fever.  Gastrointestinal: Negative for abdominal pain.    Anti-infectives: Anti-infectives   Start     Dose/Rate Route Frequency Ordered Stop   03/01/14 2000  ceFAZolin (ANCEF) IVPB 1 g/50 mL premix     1 g 100 mL/hr over 30 Minutes Intravenous Every 8 hours 03/01/14 1818 03/02/14 1959   03/01/14 1416  tobramycin (NEBCIN) powder  Status:  Discontinued       As needed 03/01/14 1417 03/01/14 1655   03/01/14 1415  vancomycin (VANCOCIN) powder  Status:  Discontinued       As needed 03/01/14 1415 03/01/14 1655   03/01/14 0600  [MAR Hold]  ceFAZolin (ANCEF) IVPB 2 g/50 mL premix     (On MAR Hold since 03/01/14 0645)   2 g 100 mL/hr over 30 Minutes Intravenous On call to O.R. 02/28/14 2114 03/01/14 0702   02/26/14 1730  ceFAZolin (ANCEF) IVPB 2 g/50 mL premix     2 g 100 mL/hr over 30 Minutes Intravenous Every 6 hours 02/26/14 1628 02/27/14 0515   02/26/14 0600  clindamycin (CLEOCIN) IVPB 900 mg     900 mg 100 mL/hr over 30 Minutes Intravenous On call to O.R. 02/26/14 0245 02/26/14 1350   02/26/14 0500  ceFAZolin (ANCEF) IVPB 1 g/50 mL premix  Status:  Discontinued     1 g 100 mL/hr over 30 Minutes Intravenous 3 times per day 02/26/14 0245 02/26/14 1615   02/25/14 2045  ceFAZolin (ANCEF) IVPB 1 g/50 mL premix     1 g 100 mL/hr over 30 Minutes Intravenous  Once 02/25/14 2040 02/25/14 2315      Current Facility-Administered Medications  Medication Dose Route Frequency Provider Last Rate Last Dose  . 0.9 %  sodium chloride infusion   Intravenous  Continuous Naiping Glee Arvin, MD 125 mL/hr at 02/28/14 0945    . acetaminophen (TYLENOL) tablet 650 mg  650 mg Oral Q6H PRN Mearl Latin, PA-C   650 mg at 03/03/14 4696   Or  . acetaminophen (TYLENOL) suppository 650 mg  650 mg Rectal Q6H PRN Mearl Latin, PA-C      . acetaminophen (TYLENOL) tablet 650 mg  650 mg Oral Once Mearl Latin, PA-C      . bacitracin ointment   Topical BID Kathryne Hitch, MD      . diphenhydrAMINE (BENADRYL) 12.5 MG/5ML elixir 25 mg  25 mg Oral Q4H PRN Naiping Glee Arvin, MD   25 mg at 03/02/14 1843  . docusate sodium (COLACE) capsule 100 mg  100 mg Oral BID Mearl Latin, PA-C   100 mg at 03/02/14 2027  . enoxaparin (LOVENOX) injection 30 mg  30 mg Subcutaneous Q12H Mearl Latin, PA-C   30 mg at 03/02/14 2027  . ferrous sulfate tablet 325 mg  325 mg Oral TID PC Mearl Latin, PA-C   325 mg at 03/02/14 1503  . folic acid (FOLVITE) tablet 1 mg  1  mg Oral Daily Budd Palmer, MD   1 mg at 03/02/14 1504  . furosemide (LASIX) injection 20 mg  20 mg Intravenous Once Mearl Latin, PA-C      . HYDROmorphone (DILAUDID) injection 1-2 mg  1-2 mg Intravenous Q3H PRN Mearl Latin, PA-C   2 mg at 03/02/14 1111  . LORazepam (ATIVAN) tablet 1 mg  1 mg Oral Q6H PRN Budd Palmer, MD   1 mg at 03/02/14 1503   Or  . LORazepam (ATIVAN) injection 1 mg  1 mg Intravenous Q6H PRN Budd Palmer, MD   1 mg at 03/01/14 2310  . menthol-cetylpyridinium (CEPACOL) lozenge 3 mg  1 lozenge Oral PRN Mearl Latin, PA-C       Or  . phenol (CHLORASEPTIC) mouth spray 1 spray  1 spray Mouth/Throat PRN Mearl Latin, PA-C      . methocarbamol (ROBAXIN) tablet 500-1,000 mg  500-1,000 mg Oral Q6H PRN Mearl Latin, PA-C   500 mg at 03/03/14 1610   Or  . methocarbamol (ROBAXIN) 1,000 mg in dextrose 5 % 50 mL IVPB  1,000 mg Intravenous Q6H PRN Mearl Latin, PA-C      . metoCLOPramide (REGLAN) tablet 5-10 mg  5-10 mg Oral Q8H PRN Mearl Latin, PA-C       Or  . metoCLOPramide (REGLAN) injection 5-10  mg  5-10 mg Intravenous Q8H PRN Mearl Latin, PA-C      . morphine 2 MG/ML injection 1 mg  1 mg Intravenous Q2H PRN Naiping Glee Arvin, MD   1 mg at 02/27/14 1029  . multivitamin with minerals tablet 1 tablet  1 tablet Oral Daily Budd Palmer, MD   1 tablet at 03/02/14 1503  . ondansetron (ZOFRAN) tablet 4 mg  4 mg Oral Q6H PRN Mearl Latin, PA-C       Or  . ondansetron [facility]) injection 4 mg  4 mg Intravenous Q6H PRN Mearl Latin, PA-C      . oxyCODONE (Oxy IR/ROXICODONE) immediate release tablet 10-20 mg  10-20 mg Oral Q3H PRN Mearl Latin, PA-C   10 mg at 03/03/14 0436  . pneumococcal 23 valent vaccine (PNU-IMMUNE) injection 0.5 mL  0.5 mL Intramuscular Tomorrow-1000 Kathryne Hitch, MD      . polyethylene glycol [facility] / GLYCOLAX) packet 17 g  17 g Oral Daily PRN Naiping Glee Arvin, MD      . polyethylene glycol [facility] / GLYCOLAX) packet 17 g  17 g Oral Daily Mearl Latin, PA-C   17 g at 03/02/14 1000  . senna (SENOKOT) tablet 8.6 mg  1 tablet Oral BID Naiping Glee Arvin, MD   8.6 mg at 03/02/14 2027  . sorbitol 70 % solution 30 mL  30 mL Oral Daily PRN Naiping Glee Arvin, MD      . tamsulosin [facility]) capsule 0.4 mg  0.4 mg Oral Daily Naiping Glee Arvin, MD   0.4 mg at 03/02/14 1503  . thiamine (VITAMIN B-1) tablet 100 mg  100 mg Oral Daily Budd Palmer, MD   100 mg at 03/02/14 1504   Or  . thiamine (B-1) injection 100 mg  100 mg Intravenous Daily Budd Palmer, MD      . warfarin (COUMADIN) video   Does not apply Once Budd Palmer, MD      . Warfarin - Pharmacist Dosing Inpatient   Does not apply [id number] Budd Palmer, MD  Objective: Vital signs in last 24 hours: Temp:  [99.3 F (37.4 C)-101.4 F (38.6 C)] 99.3 F (37.4 C) (08/24 0436) Pulse Rate:  [112-122] 117 (08/23 2222) Resp:  [16-20] 18 (08/24 0400) BP: (117-136)/(64-75) 117/64 mmHg (08/23 2222) SpO2:  [98 %-100 %] 99 % (08/24 0400)  Intake/Output from previous day: 08/23 0701 - 08/24  0700 In: 1000 [P.O.:1000] Out: 2850 [Urine:2850] Intake/Output this shift:     Physical Exam  Constitutional: He is well-developed, well-nourished, and in no distress.  Genitourinary:  Foley is draining clear urine.     Lab Results:   Recent Labs  03/02/14 0431 03/03/14 0515  WBC 8.7 11.1*  HGB 9.1* 8.2*  HCT 25.9* 23.5*  PLT 141* 147*   BMET  Recent Labs  03/01/14 1512 03/02/14 0431  NA 135* 136*  K 4.2 3.8  CL  --  98  CO2  --  29  GLUCOSE 101* 136*  BUN  --  10  CREATININE  --  0.64  CALCIUM  --  8.0*   PT/INR  Recent Labs  03/02/14 0431 03/03/14 0515  LABPROT 17.1* 20.9*  INR 1.39 1.80*   ABG No results found for this basename: PHART, PCO2, PO2, HCO3,  in the last 72 hours  Studies/Results: Dg Femur Left  03/01/2014   CLINICAL DATA:  Left femur fracture.  EXAM: DG C-ARM 61-120 MIN; LEFT FEMUR - 2 VIEW  COMPARISON:  CT scan of February 26, 2014.  FINDINGS: Seven intraoperative fluoroscopic images of the left femur were submitted for review. These images demonstrate surgical internal fixation of severely comminuted fracture of the distal left femur. Improved alignment of fracture components is noted.  IMPRESSION: Status post internal fixation of distal left femoral fracture.   Electronically Signed   By: Roque Lias M.D.   On: 03/01/2014 14:19   Dg Pelvis Comp Min 3v  03/02/2014   CLINICAL DATA:  58 year old male status post right acetabular repair. Initial encounter.  EXAM: JUDET PELVIS - 3+ VIEW  COMPARISON:  Intraoperative radiographs 03/01/2014 and earlier.  FINDINGS: Portable AP view at 0028 hrs. Malleable plate and screw fixation of the right acetabulum. Postoperative changes to the overlying soft tissues. Foley catheter in place. Postop fracture alignment appears near anatomic.  IMPRESSION: ORIF right acetabulum with no adverse features.   Electronically Signed   By: Augusto Gamble M.D.   On: 03/02/2014 02:07   Dg Pelvis 3v Judet  03/01/2014   CLINICAL  DATA:  ORIF right acetabulum  EXAM: JUDET PELVIS - 3+ VIEW  COMPARISON:  None.  FINDINGS: Eight images from portable C-arm radiography obtained in the operating room show screw and plate fixation of comminuted right acetabular fracture.  IMPRESSION: Status post ORIF of right acetabulum.   Electronically Signed   By: Signa Kell M.D.   On: 03/01/2014 16:55   Dg Femur Left Port  03/02/2014   CLINICAL DATA:  58 year old male status post distal left femur fracture repair. Initial encounter.  EXAM: PORTABLE LEFT FEMUR - 2 VIEW  COMPARISON:  03/01/2014 intraoperative images.  FINDINGS: Portable views of the left femur. Lateral distal plate and screw fixation about the comminuted distal meta diaphysis fracture. Hardware appears intact. Bone cement at the fracture site. More proximal left femur intact, evidence of previous external fixation. Left femoral head normally located. Status post ORIF right acetabulum. Foley catheter in place.  IMPRESSION: ORIF distal left femur with no adverse features identified. Previous external fixator.   Electronically Signed   By: Nedra Hai  Margo Aye M.D.   On: 03/02/2014 01:52   Dg C-arm 1-60 Min  03/01/2014   CLINICAL DATA:  ORIF right acetabulum  EXAM: DG C-ARM 61-120 MIN  TECHNIQUE: Eight images from portable C-arm radiography obtained in the operating room  CONTRAST:  No contrast.  FLUOROSCOPY TIME:  46 seconds  COMPARISON:  CT 02/26/2014  FINDINGS: Eight images show intraoperative open reduction and screw and plate fixation of the right acetabular fracture.  IMPRESSION: Status post ORIF right acetabulum.   Electronically Signed   By: Signa Kell M.D.   On: 03/01/2014 16:56   Dg C-arm 61-120 Min  03/01/2014   CLINICAL DATA:  Left femur fracture.  EXAM: DG C-ARM 61-120 MIN; LEFT FEMUR - 2 VIEW  COMPARISON:  CT scan of February 26, 2014.  FINDINGS: Seven intraoperative fluoroscopic images of the left femur were submitted for review. These images demonstrate surgical internal fixation  of severely comminuted fracture of the distal left femur. Improved alignment of fracture components is noted.  IMPRESSION: Status post internal fixation of distal left femoral fracture.   Electronically Signed   By: Roque Lias M.D.   On: 03/01/2014 14:19     Assessment: s/p Procedure(s): OPEN REDUCTION INTERNAL FIXATION (ORIF) ACETABULAR FRACTURE OPEN REDUCTION INTERNAL FIXATION (ORIF) DISTAL FEMUR FRACTURE  Urethral stricture s/p dilation.   Plan: I will order foley removal today.      LOS: 6 days    Lane Kjos J 03/03/2014

## 2014-03-03 NOTE — Progress Notes (Signed)
I saw and examined the patient with Mr. Paul, communicating the findings and plan noted above.  Sevastian Witczak, MD [facility] [phone] (p)  

## 2014-03-03 NOTE — Consult Note (Signed)
I have seen and examined the patient. I agree with the findings above.  Please see my other consultation note. In brief, R acetab fracture, left open distal femur; requires ORIF for both  Examination does not reveal NV deficit on either lower extremity.  Budd Palmer, MD 03/03/2014 4:41 PM

## 2014-03-03 NOTE — Progress Notes (Signed)
OT Cancellation Note  Patient Details Name: Cordarious Zeek MRN: [id number] DOB: [birth year]   Cancelled Treatment:    Reason Eval/Treat Not Completed: Other (comment). Pt currently gone to [facility] for XRT therapy and will be gone for a couple of hours per RN, will try to see later today as time allows or may be tomorrow.   Evette Georges [phone] 03/03/2014, 9:07 AM

## 2014-03-03 NOTE — Progress Notes (Signed)
[facility] Radiation Oncology Dept Therapy Treatment Record Phone [phone]   Radiation Therapy was administered to Danny Hansen on: 03/03/2014  10:56 AM and was treatment # 1 out of a planned course of 1 treatments.

## 2014-03-04 ENCOUNTER — Encounter (HOSPITAL_COMMUNITY): Payer: Self-pay | Admitting: Emergency Medicine

## 2014-03-04 ENCOUNTER — Inpatient Hospital Stay (HOSPITAL_COMMUNITY)
Admission: RE | Admit: 2014-03-04 | Discharge: 2014-03-14 | DRG: 945 | Disposition: A | Discharge status: Skilled Nursing Facility | Payer: No Typology Code available for payment source | Source: Intra-hospital | Attending: Physical Medicine & Rehabilitation | Admitting: Physical Medicine & Rehabilitation

## 2014-03-04 ENCOUNTER — Encounter (HOSPITAL_COMMUNITY): Payer: Self-pay | Admitting: Orthopedic Surgery

## 2014-03-04 DIAGNOSIS — S32409A Unspecified fracture of unspecified acetabulum, initial encounter for closed fracture: Secondary | ICD-10-CM

## 2014-03-04 DIAGNOSIS — F1410 Cocaine abuse, uncomplicated: Secondary | ICD-10-CM

## 2014-03-04 DIAGNOSIS — F17213 Nicotine dependence, cigarettes, with withdrawal: Secondary | ICD-10-CM

## 2014-03-04 DIAGNOSIS — S72409B Unspecified fracture of lower end of unspecified femur, initial encounter for open fracture type I or II: Secondary | ICD-10-CM

## 2014-03-04 DIAGNOSIS — J9819 Other pulmonary collapse: Secondary | ICD-10-CM

## 2014-03-04 DIAGNOSIS — Z9889 Other specified postprocedural states: Secondary | ICD-10-CM

## 2014-03-04 DIAGNOSIS — D62 Acute posthemorrhagic anemia: Secondary | ICD-10-CM

## 2014-03-04 DIAGNOSIS — M9689 Other intraoperative and postprocedural complications and disorders of the musculoskeletal system: Principal | ICD-10-CM

## 2014-03-04 DIAGNOSIS — R0902 Hypoxemia: Secondary | ICD-10-CM

## 2014-03-04 DIAGNOSIS — F1010 Alcohol abuse, uncomplicated: Secondary | ICD-10-CM

## 2014-03-04 DIAGNOSIS — B1920 Unspecified viral hepatitis C without hepatic coma: Secondary | ICD-10-CM

## 2014-03-04 DIAGNOSIS — S72409A Unspecified fracture of lower end of unspecified femur, initial encounter for closed fracture: Secondary | ICD-10-CM

## 2014-03-04 DIAGNOSIS — K5900 Constipation, unspecified: Secondary | ICD-10-CM

## 2014-03-04 DIAGNOSIS — F1029 Alcohol dependence with unspecified alcohol-induced disorder: Secondary | ICD-10-CM

## 2014-03-04 DIAGNOSIS — S32401D Unspecified fracture of right acetabulum, subsequent encounter for fracture with routine healing: Secondary | ICD-10-CM

## 2014-03-04 DIAGNOSIS — I824Z9 Acute embolism and thrombosis of unspecified deep veins of unspecified distal lower extremity: Secondary | ICD-10-CM | POA: Diagnosis not present

## 2014-03-04 DIAGNOSIS — Z5189 Encounter for other specified aftercare: Secondary | ICD-10-CM | POA: Diagnosis present

## 2014-03-04 DIAGNOSIS — M6140 Other calcification of muscle, unspecified site: Secondary | ICD-10-CM | POA: Insufficient documentation

## 2014-03-04 DIAGNOSIS — F17200 Nicotine dependence, unspecified, uncomplicated: Secondary | ICD-10-CM

## 2014-03-04 DIAGNOSIS — T1490XA Injury, unspecified, initial encounter: Secondary | ICD-10-CM | POA: Diagnosis present

## 2014-03-04 DIAGNOSIS — S329XXA Fracture of unspecified parts of lumbosacral spine and pelvis, initial encounter for closed fracture: Secondary | ICD-10-CM

## 2014-03-04 DIAGNOSIS — I82409 Acute embolism and thrombosis of unspecified deep veins of unspecified lower extremity: Secondary | ICD-10-CM | POA: Insufficient documentation

## 2014-03-04 LAB — WOUND CULTURE: CULTURE: NO GROWTH

## 2014-03-04 LAB — CBC
HCT: 22.7 % — ABNORMAL LOW (ref 39.0–52.0)
Hemoglobin: 8 g/dL — ABNORMAL LOW (ref 13.0–17.0)
MCH: 32.4 pg (ref 26.0–34.0)
MCHC: 35.2 g/dL (ref 30.0–36.0)
MCV: 91.9 fL (ref 78.0–100.0)
Platelets: 191 10*3/uL (ref 150–400)
RBC: 2.47 MIL/uL — ABNORMAL LOW (ref 4.22–5.81)
RDW: 15.1 % (ref 11.5–15.5)
WBC: 10.2 10*3/uL (ref 4.0–10.5)

## 2014-03-04 LAB — URINALYSIS, ROUTINE W REFLEX MICROSCOPIC
BILIRUBIN URINE: NEGATIVE
GLUCOSE, UA: NEGATIVE mg/dL
HGB URINE DIPSTICK: NEGATIVE
KETONES UR: NEGATIVE mg/dL
Leukocytes, UA: NEGATIVE
Nitrite: NEGATIVE
PH: 7.5 (ref 5.0–8.0)
Protein, ur: NEGATIVE mg/dL
Specific Gravity, Urine: 1.005 (ref 1.005–1.030)
Urobilinogen, UA: 2 mg/dL — ABNORMAL HIGH (ref 0.0–1.0)

## 2014-03-04 LAB — PROTIME-INR
INR: 2.97 — ABNORMAL HIGH (ref 0.00–1.49)
PROTHROMBIN TIME: 30.9 s — AB (ref 11.6–15.2)

## 2014-03-04 LAB — HEPATITIS C GENOTYPE

## 2014-03-04 MED ORDER — POLYETHYLENE GLYCOL 3350 17 G PO PACK
17.0000 g | PACK | Freq: Every day | ORAL | Status: DC
  Administered 2014-03-05 – 2014-03-08 (×4): 17 g via ORAL
  Filled 2014-03-04 (×11): qty 1

## 2014-03-04 MED ORDER — METHOCARBAMOL 500 MG PO TABS: 500.0000 mg | ORAL_TABLET | Freq: Four times a day (QID) | ORAL | Status: DC | PRN

## 2014-03-04 MED ORDER — OXYCODONE HCL 5 MG PO TABS
10.0000 mg | ORAL_TABLET | ORAL | Status: DC | PRN
  Administered 2014-03-04: 10 mg via ORAL
  Administered 2014-03-05 (×4): 20 mg via ORAL
  Administered 2014-03-06: 10 mg via ORAL
  Administered 2014-03-06 (×2): 20 mg via ORAL
  Administered 2014-03-07 – 2014-03-08 (×6): 10 mg via ORAL
  Administered 2014-03-09: 15 mg via ORAL
  Administered 2014-03-09 – 2014-03-11 (×5): 10 mg via ORAL
  Administered 2014-03-11 (×2): 15 mg via ORAL
  Administered 2014-03-12 – 2014-03-14 (×9): 10 mg via ORAL

## 2014-03-04 MED ORDER — BACITRACIN ZINC 500 UNIT/GM EX OINT
TOPICAL_OINTMENT | Freq: Two times a day (BID) | CUTANEOUS | Status: DC
  Administered 2014-03-05 – 2014-03-07 (×6): via TOPICAL
  Administered 2014-03-07: 1 via TOPICAL
  Administered 2014-03-08 – 2014-03-12 (×9): via TOPICAL
  Administered 2014-03-12: 1 via TOPICAL
  Administered 2014-03-13 – 2014-03-14 (×3): via TOPICAL
  Filled 2014-03-04: qty 28.35

## 2014-03-04 MED ORDER — FERROUS SULFATE 325 (65 FE) MG PO TABS
325.0000 mg | ORAL_TABLET | Freq: Three times a day (TID) | ORAL | Status: DC
  Administered 2014-03-04 – 2014-03-14 (×30): 325 mg via ORAL
  Filled 2014-03-04 (×34): qty 1

## 2014-03-04 MED ORDER — SORBITOL 70 % SOLN: 30.0000 mL | Freq: Every day | Status: DC | PRN

## 2014-03-04 MED ORDER — WARFARIN - PHARMACIST DOSING INPATIENT
Freq: Every day | Status: DC
  Administered 2014-03-10: 18:00:00

## 2014-03-04 MED ORDER — THIAMINE HCL 100 MG/ML IJ SOLN
100.0000 mg | Freq: Every day | INTRAMUSCULAR | Status: DC
  Filled 2014-03-04 (×11): qty 1

## 2014-03-04 MED ORDER — ONDANSETRON HCL 4 MG/2ML IJ SOLN: 4.0000 mg | Freq: Four times a day (QID) | INTRAMUSCULAR | Status: DC | PRN

## 2014-03-04 MED ORDER — ADULT MULTIVITAMIN W/MINERALS CH
1.0000 | ORAL_TABLET | Freq: Every day | ORAL | Status: DC
  Administered 2014-03-05 – 2014-03-14 (×10): 1 via ORAL
  Filled 2014-03-04 (×12): qty 1

## 2014-03-04 MED ORDER — ONDANSETRON HCL 4 MG PO TABS: 4.0000 mg | ORAL_TABLET | Freq: Four times a day (QID) | ORAL | Status: DC | PRN

## 2014-03-04 MED ORDER — DOCUSATE SODIUM 100 MG PO CAPS
100.0000 mg | ORAL_CAPSULE | Freq: Two times a day (BID) | ORAL | Status: DC
  Administered 2014-03-04 – 2014-03-14 (×20): 100 mg via ORAL
  Filled 2014-03-04 (×25): qty 1

## 2014-03-04 MED ORDER — WARFARIN SODIUM 5 MG PO TABS: 5.0000 mg | ORAL_TABLET | Freq: Once | ORAL | Status: DC

## 2014-03-04 MED ORDER — VITAMIN B-1 100 MG PO TABS
100.0000 mg | ORAL_TABLET | Freq: Every day | ORAL | Status: DC
  Administered 2014-03-05 – 2014-03-14 (×10): 100 mg via ORAL
  Filled 2014-03-04 (×14): qty 1

## 2014-03-04 MED ORDER — FOLIC ACID 1 MG PO TABS
1.0000 mg | ORAL_TABLET | Freq: Every day | ORAL | Status: DC
  Administered 2014-03-05 – 2014-03-14 (×10): 1 mg via ORAL
  Filled 2014-03-04 (×13): qty 1

## 2014-03-04 MED ORDER — ACETAMINOPHEN 325 MG PO TABS
325.0000 mg | ORAL_TABLET | ORAL | Status: DC | PRN
  Administered 2014-03-04 – 2014-03-07 (×4): 650 mg via ORAL

## 2014-03-04 MED ORDER — SENNA 8.6 MG PO TABS
1.0000 | ORAL_TABLET | Freq: Two times a day (BID) | ORAL | Status: DC
  Administered 2014-03-04 – 2014-03-14 (×20): 8.6 mg via ORAL
  Filled 2014-03-04 (×21): qty 1

## 2014-03-04 NOTE — Progress Notes (Signed)
Patient is medically ready for rehab.  This patient has been seen and I agree with the findings and treatment plan.  Danny Hansen. Gae Bon, MD, FACS [phone] (pager) [phone] (direct pager) Trauma Surgeon
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
## 2014-03-04 NOTE — Progress Notes (Signed)
  Radiation Oncology         [phone] ________________________________  Name: Danny Hansen MRN: [id number]  Date: 03/03/2014  DOB: [birth year]  SIMULATION AND TREATMENT PLANNING NOTE  DIAGNOSIS:  Postoperative heterotopic ossification   Site:  Right hip  NARRATIVE:  The patient was brought to the treatment suite.  Identity was confirmed.  All relevant records and images related to the planned course of therapy were reviewed.   Written consent to proceed with treatment was confirmed which was freely given after reviewing the details related to the planned course of therapy had been reviewed with the patient.  Then, the patient was set-up in a stable reproducible  supine position for radiation therapy.    Films were taken of the patient in the treatment position. The target region was delineated and a radiation treatment field was set up targeting the appropriate area. In this fashion, APPA fields were generated to treat the right hip region including the high risk region superior and inferior to the right hip joint.  PLAN:  The patient will receive 6 Gy in 1 fractions.  ________________________________   Radene Gunning, MD, PhD

## 2014-03-04 NOTE — Progress Notes (Signed)
Inpatient Rehabilitation  I met with Danny Hansen at the bedside and discussed over the phone with his sister, Danny Hansen, pt's options for rehab recovery.  I answered all their questions and made it clear that pt. will need to be at a wheelchair level until cleared by orthopedics weeks down the road.  I provided informational booklets and my contact info.  They have elected for pt. to come to IP rehab for his rehab needs.  I discussed with Danny Gunner, PA   who gives medical clearance.  Also notified his RN and the SW and CM .  Will arrange for admit today.  Please call if questions.  [place] Admissions Coordinator Cell [phone] Office [phone]
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
## 2014-03-04 NOTE — Progress Notes (Signed)
Physical Therapy Treatment Patient Details Name: Danny Hansen MRN: [id number] DOB: [birth year] Today's Date: 03/04/2014    History of Present Illness 58 y.o. s/p ORIF right acetabulum and ORIF left distal femur.    PT Comments    Patient continues to work well with physical therapy progressing towards acute PT goals. Tolerating therapeutic exercises and transfer training well. Educated on precautions and importance of frequent mobility and exercise. Patient will continue to benefit from skilled physical therapy services to further improve independence with functional mobility.   Follow Up Recommendations  CIR;Supervision/Assistance - 24 hour     Equipment Recommendations  3in1 (PT);Wheelchair (measurements PT);Wheelchair cushion (measurements PT) (sliding board; 3 in 1 with drop arm.)    Recommendations for Other Services Rehab consult;OT consult     Precautions / Restrictions Precautions Precautions: Posterior Hip;Fall (Posterior hip on Rt) Precaution Comments: NWB LLE, TDWB RLE (A/PROM to left knee allowed) Required Braces or Orthoses: Other Brace/Splint (bledsoe brace LLE.) Restrictions Weight Bearing Restrictions: Yes RLE Weight Bearing: Touchdown weight bearing LLE Weight Bearing: Non weight bearing    Mobility  Bed Mobility Overal bed mobility: Needs Assistance Bed Mobility: Supine to Sit     Supine to sit: Min assist     General bed mobility comments: Min assist for LE control to maintain NWB at edge of bed. VC for technique. Able to long sit prior to scooting to EOB with min assist to pull bed pad forward.  Transfers Overall transfer level: Needs assistance Equipment used: None Transfers: Lateral/Scoot Transfers          Lateral/Scoot Transfers: Min assist;+2 safety/equipment General transfer comment: Min assist for LLE support during lateral scoot to patients Rt side from bed to chair. Intermittent tactile cues for RLE to maintain TDWB status. VC for  technique. second person available for safety.  Ambulation/Gait                 Stairs            Wheelchair Mobility    Modified Rankin (Stroke Patients Only)       Balance                                    Cognition Arousal/Alertness: Awake/alert Behavior During Therapy: WFL for tasks assessed/performed Overall Cognitive Status: Within Functional Limits for tasks assessed                      Exercises General Exercises - Lower Extremity Ankle Circles/Pumps: Both;10 reps;AROM;Supine Quad Sets: AROM;Both;10 reps;Supine Long Arc Quad: AAROM;Left;10 reps;Seated Heel Slides: AAROM;Left;10 reps;Supine Hip ABduction/ADduction: AAROM;Both;10 reps;Supine    General Comments General comments (skin integrity, edema, etc.): Bledose brace found in several pieces throughout bed. Time spent educating patient how to safely don/doff this device.      Pertinent Vitals/Pain Pain Assessment: 0-10 Pain Score: 2  Pain Location: left knee and right hip Pain Intervention(s): Limited activity within patient's tolerance;Monitored during session;Repositioned    Home Living                      Prior Function            PT Goals (current goals can now be found in the care plan section) Acute Rehab PT Goals PT Goal Formulation: Patient unable to participate in goal setting Time For Goal Achievement: 03/09/14 Potential to Achieve Goals: Good Progress towards PT  goals: Progressing toward goals    Frequency  Min 5X/week    PT Plan Current plan remains appropriate    Co-evaluation             End of Session Equipment Utilized During Treatment: Other (comment) (bledsoe brace Lt) Activity Tolerance: Patient tolerated treatment well Patient left: with call bell/phone within reach;with bed alarm set;in chair     Time: 1011-1043 PT Time Calculation (min): 32 min  Charges:  $Therapeutic Exercise: 8-22 mins $Therapeutic Activity:  8-22 mins                    G Codes:      Danny Hansen, Danny Hansen [phone]  Danny Hansen 03/04/2014, 11:32 AM
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
## 2014-03-04 NOTE — Progress Notes (Signed)
Patient ID: Danny Hansen, male   DOB: Dec 07, 1955, 58 y.o.   MRN: [id number]   LOS: 7 days   Subjective: No new issues, about to work with PT   Objective: Vital signs in last 24 hours: Temp:  [98.5 F (36.9 C)-100 F (37.8 C)] 100 F (37.8 C) (08/25 0615) Pulse Rate:  [101-114] 102 (08/25 0615) Resp:  [18] 18 (08/24 1406) BP: (130-160)/(58-67) 130/58 mmHg (08/25 0615) SpO2:  [90 %-96 %] 96 % (08/25 0615) Last BM Date: 03/03/14   Laboratory  CBC  Recent Labs  03/03/14 0515 03/04/14 0620  WBC 11.1* 10.2  HGB 8.2* 8.0*  HCT 23.5* 22.7*  PLT 147* 191   Lab Results  Component Value Date   INR 2.97* 03/04/2014   INR 1.80* 03/03/2014   INR 1.39 03/02/2014    Physical Exam General appearance: alert and no distress Resp: clear to auscultation bilaterally Cardio: Mild tachycardia GI: normal findings: bowel sounds normal and soft, non-tender   Assessment/Plan: [facility]  Right periureteral stranding -- Renal fxn stable, can stop daily renal fxn labs  Right acet fx s/p ORIF -- NWB  Open left femur fx s/p I&D, ex fix, delayed ORIF -- NWB  Abd laceration -- Superficial, local care  ABL anemia -- Stable Urethral stricture -- No issues with foley out Daily EtOH use -- CIWA protocol  FEN -- No issues VTE -- SCD's, D/C Lovenox, coumadin  Dispo -- Transfer to CIR when bed available    Freeman Caldron, PA-C Pager: [phone] General Trauma PA Pager: [phone]  03/04/2014

## 2014-03-04 NOTE — Progress Notes (Signed)
Report called to rehab nurse Doree Fudge pt transferred per recliner
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
## 2014-03-05 ENCOUNTER — Inpatient Hospital Stay (HOSPITAL_COMMUNITY): Payer: No Typology Code available for payment source

## 2014-03-05 ENCOUNTER — Inpatient Hospital Stay (HOSPITAL_COMMUNITY): Payer: Self-pay

## 2014-03-05 ENCOUNTER — Inpatient Hospital Stay (HOSPITAL_COMMUNITY): Payer: Self-pay | Admitting: Occupational Therapy

## 2014-03-05 DIAGNOSIS — M7989 Other specified soft tissue disorders: Secondary | ICD-10-CM

## 2014-03-05 DIAGNOSIS — IMO0001 Reserved for inherently not codable concepts without codable children: Secondary | ICD-10-CM

## 2014-03-05 DIAGNOSIS — S7290XB Unspecified fracture of unspecified femur, initial encounter for open fracture type I or II: Secondary | ICD-10-CM

## 2014-03-05 LAB — COMPREHENSIVE METABOLIC PANEL
ALBUMIN: 2.1 g/dL — AB (ref 3.5–5.2)
ALT: 33 U/L (ref 0–53)
AST: 76 U/L — AB (ref 0–37)
Alkaline Phosphatase: 57 U/L (ref 39–117)
Anion gap: 12 (ref 5–15)
BILIRUBIN TOTAL: 1.6 mg/dL — AB (ref 0.3–1.2)
BUN: 10 mg/dL (ref 6–23)
CHLORIDE: 90 meq/L — AB (ref 96–112)
CO2: 27 mEq/L (ref 19–32)
CREATININE: 0.62 mg/dL (ref 0.50–1.35)
Calcium: 8 mg/dL — ABNORMAL LOW (ref 8.4–10.5)
GFR calc Af Amer: >90 mL/min (ref >90)
GFR calc non Af Amer: >90 mL/min (ref >90)
Glucose, Bld: 101 mg/dL — ABNORMAL HIGH (ref 70–99)
Potassium: 3.4 mEq/L — ABNORMAL LOW (ref 3.7–5.3)
Sodium: 129 mEq/L — ABNORMAL LOW (ref 137–147)
Total Protein: 6 g/dL (ref 6.0–8.3)

## 2014-03-05 LAB — PROTIME-INR
INR: 2.2 — AB (ref 0.00–1.49)
PROTHROMBIN TIME: 24.4 s — AB (ref 11.6–15.2)

## 2014-03-05 LAB — CBC WITH DIFFERENTIAL/PLATELET
BASOS ABS: 0 10*3/uL (ref 0.0–0.1)
Basophils Relative: 0 % (ref 0–1)
Eosinophils Absolute: 0.1 10*3/uL (ref 0.0–0.7)
Eosinophils Relative: 1 % (ref 0–5)
HCT: 24.4 % — ABNORMAL LOW (ref 39.0–52.0)
Hemoglobin: 8.6 g/dL — ABNORMAL LOW (ref 13.0–17.0)
LYMPHS ABS: 1.6 10*3/uL (ref 0.7–4.0)
LYMPHS PCT: 15 % (ref 12–46)
MCH: 32.8 pg (ref 26.0–34.0)
MCHC: 35.2 g/dL (ref 30.0–36.0)
MCV: 93.1 fL (ref 78.0–100.0)
MONOS PCT: 18 % — AB (ref 3–12)
Monocytes Absolute: 2 10*3/uL — ABNORMAL HIGH (ref 0.1–1.0)
Neutro Abs: 7.2 10*3/uL (ref 1.7–7.7)
Neutrophils Relative %: 66 % (ref 43–77)
PLATELETS: 238 10*3/uL (ref 150–400)
RBC: 2.62 MIL/uL — ABNORMAL LOW (ref 4.22–5.81)
RDW: 15.4 % (ref 11.5–15.5)
WBC: 10.9 10*3/uL — AB (ref 4.0–10.5)

## 2014-03-05 LAB — BLOOD GAS, ARTERIAL
ACID-BASE EXCESS: 3.6 mmol/L — AB (ref 0.0–2.0)
BICARBONATE: 27 meq/L — AB (ref 20.0–24.0)
Drawn by: 276051
FIO2: 0.21 %
O2 SAT: 93.8 %
PCO2 ART: 36.9 mmHg (ref 35.0–45.0)
PO2 ART: 66.2 mmHg — AB (ref 80.0–100.0)
Patient temperature: 98.6
TCO2: 28.2 mmol/L (ref 0–100)
pH, Arterial: 7.478 — ABNORMAL HIGH (ref 7.350–7.450)

## 2014-03-05 MED ORDER — WARFARIN SODIUM 2.5 MG PO TABS
2.5000 mg | ORAL_TABLET | Freq: Once | ORAL | Status: AC
  Administered 2014-03-05: 2.5 mg via ORAL
  Filled 2014-03-05: qty 1

## 2014-03-05 MED ORDER — POTASSIUM CHLORIDE CRYS ER 20 MEQ PO TBCR
20.0000 meq | EXTENDED_RELEASE_TABLET | Freq: Every day | ORAL | Status: AC
  Administered 2014-03-05 – 2014-03-06 (×2): 20 meq via ORAL
  Filled 2014-03-05 (×2): qty 1

## 2014-03-05 MED ORDER — IPRATROPIUM-ALBUTEROL 0.5-2.5 (3) MG/3ML IN SOLN
3.0000 mL | Freq: Four times a day (QID) | RESPIRATORY_TRACT | Status: DC
  Administered 2014-03-05 – 2014-03-06 (×3): 3 mL via RESPIRATORY_TRACT
  Filled 2014-03-05 (×3): qty 3
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
## 2014-03-05 NOTE — Progress Notes (Signed)
Patient positive for DVTs in bilateral calves.  Deatra Ina, PA notified and aware.  PA states that patient can continue therapy without restrictions.  Will continue to monitor.

## 2014-03-05 NOTE — Progress Notes (Signed)
ANTICOAGULATION CONSULT NOTE - Follow Up Consult  Pharmacy Consult for coumadin Indication: VTE prophylaxis  No Known Allergies  Patient Measurements: Height:  (167.6 cm) IBW/kg (Calculated) : 63.8 Heparin Dosing Weight:   Vital Signs: Temp: 98.4 F (36.9 C) (08/26 0519) Temp src: Oral (08/26 0519) BP: 129/73 mmHg (08/26 0519) Pulse Rate: 90 (08/26 0519)  Labs:  Recent Labs  03/03/14 0515 03/04/14 0620 03/05/14 0409  HGB 8.2* 8.0* 8.6*  HCT 23.5* 22.7* 24.4*  PLT 147* 191 238  LABPROT 20.9* 30.9* 24.4*  INR 1.80* 2.97* 2.20*  CREATININE  --   --  0.62    The CrCl is unknown because both a height and weight (above a minimum accepted value) are required for this calculation.   Medications:  Scheduled:  . bacitracin   Topical BID  . docusate sodium  100 mg Oral BID  . ferrous sulfate  325 mg Oral TID PC  . folic acid  1 mg Oral Daily  . multivitamin with minerals  1 tablet Oral Daily  . polyethylene glycol  17 g Oral Daily  . potassium chloride  20 mEq Oral Daily  . senna  1 tablet Oral BID  . thiamine  100 mg Oral Daily   Or  . thiamine  100 mg Intravenous Daily  . Warfarin - Pharmacist Dosing Inpatient   Does not apply q1800   Infusions:    Assessment: 58 yo male s/p ortho surgery is currently on therapeutic coumadin.  INR down to 2.2 from 2.98 after dose held last night due to big jump in INR. Goal of Therapy:  INR 2-3 Monitor platelets by anticoagulation protocol: Yes   Plan:  Coumadin 2.5mg  po x1 INR in am   Shanon Seawright, Tsz-Yin 03/05/2014,8:08 AM

## 2014-03-05 NOTE — Progress Notes (Signed)
*  Preliminary Results* Bilateral lower extremity venous duplex completed. Bilateral lower extremities are positive for deep vein thrombosis involving bilateral posterior tibial and peroneal veins. There is no evidence of Baker's cyst bilaterally.  Preliminary results discussed with Cala Bradford, RN.  03/05/2014  Gertie Fey, RVT, RDCS, RDMS

## 2014-03-05 NOTE — Progress Notes (Signed)
Occupational Therapy Session Note  Patient Details  Name: Danny Hansen MRN: [id number] Date of Birth: Jul 09, 1956  Today's Date: 03/05/2014 OT Individual Time: 1400-1430 OT Individual Time Calculation (min): 30 min   Short Term Goals: Week 1:  OT Short Term Goal 1 (Week 1): Focus on LTGs d/t short ELOS  Skilled Therapeutic Interventions/Progress Updates:  Patient received supine in bed with no complaints of pain. Patient engaged in bed mobility with close supervision. Patient then sat EOB and engaged in therapeutic activity in order to increase independence with lateral leans to increase independence with LB ADLs. Patient also engaged in therapeutic exercise using theraband for BUE strengthening, therapist taught patient a BUE HEP. At end of session, left patient seated EOB with all needs within reach and bed alarm set.   Precautions:  Precautions Precautions: Posterior Hip;Fall Precaution Comments: NWB LLE, TDWB RLE; Pt able to accurately recall 50% of precautions- throguhout session Required Braces or Orthoses: Other Brace/Splint Other Brace/Splint: bledsoe brace to LLE Restrictions Weight Bearing Restrictions: Yes RLE Weight Bearing: Touchdown weight bearing LLE Weight Bearing: Non weight bearing  See FIM for current functional status  Therapy/Group: Individual Therapy  Dacoda Finlay 03/05/2014, 3:25 PM

## 2014-03-05 NOTE — Progress Notes (Signed)
Patient has multiple open areas on left leg with serosanguinous drainage and yellow wound beds.  Deatra Ina, PA at bedside to assess patient and ordered a WOC consult.  Dry dressings reapplied per order.  Will continue to monitor.

## 2014-03-05 NOTE — Progress Notes (Signed)
Patient information reviewed and entered into [facility] system by Daleyza Gadomski, RN, CRRN, PPS Coordinator.  Information including medical coding and functional independence measure will be reviewed and updated through discharge.    

## 2014-03-05 NOTE — Progress Notes (Signed)
Per Carlean Purl, RN, patient complaining of feeling hot and was clammy to the touch.  BP 125/69, heartrate 54-85, O2 sats 86%.  Upon recheck, O2 sats in 77% on room air and heart rate in the 40s per dinamap; pt asymptomatic.  Marissa Nestle, PA notified and at bedside.  Vitals checked again and BP 110/87, heartrate 83, and O2 sats 98% on room air.  New orders received; will continue to monitor.
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
## 2014-03-05 NOTE — Discharge Instructions (Addendum)
Inpatient Rehab Discharge Instructions  Danny Hansen Discharge date and time: No discharge date for patient encounter.   Activities/Precautions/ Functional Status: Activity: Touchdown weightbearing right lower extremity and nonweightbearing left lower gravity with brace Diet: regular diet Wound Care: keep wound clean and dry Functional status:  ___ No restrictions     ___ Walk up steps independently ___ 24/7 supervision/assistance   ___ Walk up steps with assistance ___ Intermittent supervision/assistance  ___ Bathe/dress independently ___ Walk with walker     ___ Bathe/dress with assistance ___ Walk Independently    ___ Shower independently _x__ Walk with assistance    ___ Shower with assistance ___ No alcohol     ___ Return to work/school ________  Special Instructions: No alcohol or illicit drugs   My questions have been answered and I understand these instructions. I will adhere to these goals and the provided educational materials after my discharge from the hospital.  Patient/Caregiver Signature _______________________________ Date __________  Clinician Signature _______________________________________ Date __________  Please bring this form and your medication list with you to all your follow-up doctor's appointments.    Information on my medicine - Coumadin   (Warfarin)  This medication education was reviewed with me or my healthcare representative as part of my discharge preparation.  The pharmacist that spoke with me during my hospital stay was:  Clyda Hurdle, PharmD  Why was Coumadin prescribed for you? Coumadin was prescribed for you because you have a blood clot or a medical condition that can cause an increased risk of forming blood clots. Blood clots can cause serious health problems by blocking the flow of blood to the heart, lung, or brain. Coumadin can prevent harmful blood clots from forming. As a reminder your indication for Coumadin is:   Deep Vein  Thrombosis Treatment  What test will check on my response to Coumadin? While on Coumadin (warfarin) you will need to have an INR test regularly to ensure that your dose is keeping you in the desired range. The INR (international normalized ratio) number is calculated from the result of the laboratory test called prothrombin time (PT).  If an INR APPOINTMENT HAS NOT ALREADY BEEN MADE FOR YOU please schedule an appointment to have this lab work done by your health care provider within 7 days. Your INR goal is usually a number between:  2 to 3 or your provider may give you a more narrow range like 2-2.5.  Ask your health care provider during an office visit what your goal INR is.  What  do you need to  know  About  COUMADIN? Take Coumadin (warfarin) exactly as prescribed by your healthcare provider about the same time each day.  DO NOT stop taking without talking to the doctor who prescribed the medication.  Stopping without other blood clot prevention medication to take the place of Coumadin may increase your risk of developing a new clot or stroke.  Get refills before you run out.  What do you do if you miss a dose? If you miss a dose, take it as soon as you remember on the same day then continue your regularly scheduled regimen the next day.  Do not take two doses of Coumadin at the same time.  Important Safety Information A possible side effect of Coumadin (Warfarin) is an increased risk of bleeding. You should call your healthcare provider right away if you experience any of the following:   Bleeding from an injury or your nose that does not stop.  Unusual colored urine (red or dark brown) or unusual colored stools (red or black).   Unusual bruising for unknown reasons.   A serious fall or if you hit your head (even if there is no bleeding).  Some foods or medicines interact with Coumadin (warfarin) and might alter your response to warfarin. To help avoid this:   Eat a balanced diet,  maintaining a consistent amount of Vitamin K.   Notify your provider about major diet changes you plan to make.   Avoid alcohol or limit your intake to 1 drink for women and 2 drinks for men per day. (1 drink is 5 oz. wine, 12 oz. beer, or 1.5 oz. liquor.)  Make sure that ANY health care provider who prescribes medication for you knows that you are taking Coumadin (warfarin).  Also make sure the healthcare provider who is monitoring your Coumadin knows when you have started a new medication including herbals and non-prescription products.  Coumadin (Warfarin)  Major Drug Interactions  Increased Warfarin Effect Decreased Warfarin Effect  Alcohol (large quantities) Antibiotics (esp. Septra/Bactrim, Flagyl, Cipro) Amiodarone (Cordarone) Aspirin (ASA) Cimetidine (Tagamet) Megestrol (Megace) NSAIDs (ibuprofen, naproxen, etc.) Piroxicam (Feldene) Propafenone (Rythmol SR) Propranolol (Inderal) Isoniazid (INH) Posaconazole (Noxafil) Barbiturates (Phenobarbital) Carbamazepine (Tegretol) Chlordiazepoxide (Librium) Cholestyramine (Questran) Griseofulvin Oral Contraceptives Rifampin Sucralfate (Carafate) Vitamin K   Coumadin (Warfarin) Major Herbal Interactions  Increased Warfarin Effect Decreased Warfarin Effect  Garlic Ginseng Ginkgo biloba Coenzyme Q10 Green tea St. Johns wort    Coumadin (Warfarin) FOOD Interactions  Eat a consistent number of servings per week of foods HIGH in Vitamin K (1 serving =  cup)  Collards (cooked, or boiled & drained) Kale (cooked, or boiled & drained) Mustard greens (cooked, or boiled & drained) Parsley *serving size only =  cup Spinach (cooked, or boiled & drained) Swiss chard (cooked, or boiled & drained) Turnip greens (cooked, or boiled & drained)  Eat a consistent number of servings per week of foods MEDIUM-HIGH in Vitamin K (1 serving = 1 cup)  Asparagus (cooked, or boiled & drained) Broccoli (cooked, boiled & drained, or raw &  chopped) Brussel sprouts (cooked, or boiled & drained) *serving size only =  cup Lettuce, raw (green leaf, endive, romaine) Spinach, raw Turnip greens, raw & chopped   These websites have more information on Coumadin (warfarin):  http://www.king-russell.com/; https://www.hines.net/;
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
## 2014-03-05 NOTE — Progress Notes (Signed)
Occupational Therapy Session Note  Patient Details  Name: Danny Hansen MRN: [id number] Date of Birth: [birth year]  Today's Date: 03/05/2014 OT Individual Time: 1300-1330 OT Individual Time Calculation (min): 30 min   Short Term Goals: Week 1:  OT Short Term Goal 1 (Week 1): Focus on LTGs d/t short ELOS  Skilled Therapeutic Interventions/Progress Updates:  Pt engaged in scoot transfer w/c->drop arm BSC and toileting tasks.  Pt required max verbal cues to maintain WBing precautions during transfer.  Demonstrated later leans to facilitate hygiene but patient unable to lean onto elbows at this time.  Question whether patient is maintaining TDWB on RLE.  Pt used sliding board to transfer back to w/c.  Pt transferred w/c->bed with scoot transfer.  Transfers performed with steady A.  Focus on transfers and toileting, activity tolerance and safety awareness.  After transfer to [facility] patient felt clammy. Vitals: BP-125/69, HR-range of 54-85, and O2 sats at 86% on RA.  RN present when vitals were recorded. Therapy Documentation Precautions:  Precautions Precautions: Posterior Hip;Fall Precaution Comments: NWB LLE, TDWB RLE; Pt able to accurately recall 50% of precautions- throguhout session Required Braces or Orthoses: Other Brace/Splint Other Brace/Splint: bledsoe brace to LLE Restrictions Weight Bearing Restrictions: Yes RLE Weight Bearing: Touchdown weight bearing LLE Weight Bearing: Non weight bearing Pain: Pain Assessment Pain Assessment: No/denies pain See FIM for current functional status  Therapy/Group: Individual Therapy  [name] 03/05/2014, 2:43 PM
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
## 2014-03-05 NOTE — Progress Notes (Signed)
Patient reported to be hypoxic with desaturation to mid 70's with pulse in 40's per machine. No distress reported and lungs sounds clear. Apical pulse 82 bpm. Will check CXR. Add nebs as is a smoker. Will check ABG for accuracy as patient has callused fingertips.

## 2014-03-06 ENCOUNTER — Inpatient Hospital Stay (HOSPITAL_COMMUNITY): Payer: Self-pay

## 2014-03-06 ENCOUNTER — Inpatient Hospital Stay (HOSPITAL_COMMUNITY): Payer: No Typology Code available for payment source

## 2014-03-06 ENCOUNTER — Encounter (HOSPITAL_COMMUNITY): Payer: Self-pay

## 2014-03-06 LAB — URINE CULTURE
COLONY COUNT: NO GROWTH
CULTURE: NO GROWTH

## 2014-03-06 LAB — ANAEROBIC CULTURE

## 2014-03-06 LAB — PROTIME-INR
INR: 2.06 — AB (ref 0.00–1.49)
PROTHROMBIN TIME: 23.2 s — AB (ref 11.6–15.2)

## 2014-03-06 MED ORDER — IPRATROPIUM-ALBUTEROL 0.5-2.5 (3) MG/3ML IN SOLN: 3.0000 mL | Freq: Four times a day (QID) | RESPIRATORY_TRACT | Status: DC | PRN

## 2014-03-06 MED ORDER — WARFARIN SODIUM 5 MG PO TABS
5.0000 mg | ORAL_TABLET | Freq: Once | ORAL | Status: AC
  Administered 2014-03-06: 5 mg via ORAL
  Filled 2014-03-06: qty 1

## 2014-03-06 NOTE — Progress Notes (Signed)
Occupational Therapy Session Note  Patient Details  Name: Danny Hansen MRN: [id number] Date of Birth: September 30, 1955  Today's Date: 03/06/2014 OT Individual Time: 1300-1400 OT Individual Time Calculation (min): 60 min    Short Term Goals: Week 1:  OT Short Term Goal 1 (Week 1): Focus on LTGs d/t short ELOS  Skilled Therapeutic Interventions/Progress Updates:    Pt asleep upon arrival but easily aroused.  Pt initially reluctant to participate in therapy but once fully aroused patient eager to get OOB and into w/c.  Initial focus on bed mobility, motor planning, and safety awareness.  Pt required max verbal cues for sequencing while moving to EOB in preparation for sliding board transfer to w/c.  Pt required max verbal cues to correctly perform lateral leans.  Once sliding board was placed pt performed sliding board transfer with steady A.  Pt propelled w/c to therapy gym and engaged in BUE therex with 5# weight bar.  Pt c/o increased pain in upper chest in pectoral area; RN notified.  Pt also stated that area was painful when he coughed.   Therapy Documentation Precautions:  Precautions Precautions: Posterior Hip;Fall Precaution Comments: NWB LLE, TDWB RLE; Pt able to accurately recall 50% of precautions- throguhout session Required Braces or Orthoses: Other Brace/Splint Other Brace/Splint: bledsoe brace to LLE Restrictions Weight Bearing Restrictions: Yes RLE Weight Bearing: Touchdown weight bearing LLE Weight Bearing: Non weight bearing Pain: Pain Assessment Pain Score: 5  Pain Location: Leg (and R hip), right chest upper pectoral area Pain Orientation: Left;Distal Pain Intervention(s): RN aware; repositioned  See FIM for current functional status  Therapy/Group: Co-Treatment  [name] 03/06/2014, 2:07 PM

## 2014-03-06 NOTE — Plan of Care (Signed)
Problem: RH PAIN MANAGEMENT Goal: RH STG PAIN MANAGED AT OR BELOW PT'S PAIN GOAL </=4  Outcome: Progressing Prn oxy IR 10-20mg  effective in controlling pain
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
## 2014-03-06 NOTE — Progress Notes (Signed)
Physical Therapy Session Note  Patient Details  Name: Danny Hansen MRN: [id number] Date of Birth: [birth year]  Today's Date: 03/06/2014 PT Individual Time: 1105-1155 PT Individual Time Calculation (min): 50 min   Short Term Goals: Week 1:  PT Short Term Goal 1 (Week 1): STGs=LTGs due to anticipated LOS  Skilled Therapeutic Interventions/Progress Updates: Pt declined getting OOB due to being "worn out" but was willing to do bedside tx.  Therapeutic exercise performed with LE to increase strength for functional mobility: in supine in bed:  active assistive R and L hip abduction, active assistive L straight leg raise, resisted bil hip add, bil hip IR (from ER 'd position to neutral), alternating ankle pumps, R heel slides, modified abdominal crunches, bil shoulder protraction 2 x 10 each, and R quad sets 10 x 1 ; in R sidelying: active assistive L hip ext 2 x 10, ; and in L sidelying:  R knee flexion/ext 10 x 1.  Bed mobility using bed rails with mod assist to get into sidelying, towels rolls to ensure R hip precautions.  10 min tx missed due to fatigue.  Pt exhausted by end of session.    Therapy Documentation Precautions:  Precautions Precautions: Posterior Hip;Fall Precaution Comments: NWB LLE, TDWB RLE; Pt able to accurately recall 50% of precautions- throguhout session Required Braces or Orthoses: Other Brace/Splint Other Brace/Splint: bledsoe brace to LLE Restrictions Weight Bearing Restrictions: Yes RLE Weight Bearing: Touchdown weight bearing LLE Weight Bearing: Non weight bearing General: PT Amount of Missed Time (min): 10 Minutes PT Missed Treatment Reason: Patient fatigue Vital Signs: Therapy Vitals Temp: 99.8 F (37.7 C) Temp src: Oral Pulse Rate: 84 Resp: 18 BP: 144/72 mmHg Patient Position (if appropriate): Sitting Oxygen Therapy SpO2: 97 % O2 Device: None (Room air) Pain: L hip, 8/10 , premedicated      See FIM for current functional  status  Therapy/Group: Individual Therapy  Edelman,Desani Sprung 03/06/2014, 4:00 PM
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
## 2014-03-06 NOTE — Progress Notes (Signed)
Occupational Therapy Session Note  Patient Details  Name: Danny Hansen MRN: [id number] Date of Birth: [birth year]  Today's Date: 03/06/2014 OT Individual Time: 4098-1191 OT Individual Time Calculation (min): 60 min   Short Term Goals: Week 1:  OT Short Term Goal 1 (Week 1): Focus on LTGs d/t short ELOS  Skilled Therapeutic Interventions/Progress Updates:    Pt resting in bed upon arrival but agreeable to bathing at bed level.  Pt completed bathing at bed level requiring assistance only with bathing feet.  Pt required assistance moving BLE to edge of bed in preparation for sliding board transfer to w/c.  Pt required max verbal cues and demonstrational cues for lateral leans to facilitate placement of sliding board.  Pt performed sliding board transfer with steady A.  Pt required mod verbal cues for safety awareness and continues to exhibit slight impulsivity with transitional movements.  Focus on bed mobility, transfers, activity tolerance, and safety awareness.  Therapy Documentation Precautions:  Precautions Precautions: Posterior Hip;Fall Precaution Comments: NWB LLE, TDWB RLE; Pt able to accurately recall 50% of precautions- throguhout session Required Braces or Orthoses: Other Brace/Splint Other Brace/Splint: bledsoe brace to LLE Restrictions Weight Bearing Restrictions: Yes RLE Weight Bearing: Touchdown weight bearing LLE Weight Bearing: Non weight bearing Pain: Pain Assessment Pain Assessment: No/denies pain  See FIM for current functional status  Therapy/Group: Individual Therapy  [name] 03/06/2014, 9:00 AM

## 2014-03-07 ENCOUNTER — Inpatient Hospital Stay (HOSPITAL_COMMUNITY): Payer: Self-pay

## 2014-03-07 ENCOUNTER — Inpatient Hospital Stay (HOSPITAL_COMMUNITY): Payer: Self-pay | Admitting: Physical Therapy

## 2014-03-07 ENCOUNTER — Encounter (HOSPITAL_COMMUNITY): Payer: Self-pay

## 2014-03-07 ENCOUNTER — Inpatient Hospital Stay (HOSPITAL_COMMUNITY): Payer: Self-pay | Admitting: *Deleted

## 2014-03-07 ENCOUNTER — Inpatient Hospital Stay (HOSPITAL_COMMUNITY): Payer: No Typology Code available for payment source

## 2014-03-07 LAB — PROTIME-INR
INR: 1.9 — ABNORMAL HIGH (ref 0.00–1.49)
PROTHROMBIN TIME: 21.8 s — AB (ref 11.6–15.2)

## 2014-03-07 LAB — CBC
HCT: 29.8 % — ABNORMAL LOW (ref 39.0–52.0)
Hemoglobin: 10.2 g/dL — ABNORMAL LOW (ref 13.0–17.0)
MCH: 32.6 pg (ref 26.0–34.0)
MCHC: 34.2 g/dL (ref 30.0–36.0)
MCV: 95.2 fL (ref 78.0–100.0)
Platelets: 352 10*3/uL (ref 150–400)
RBC: 3.13 MIL/uL — AB (ref 4.22–5.81)
RDW: 15.4 % (ref 11.5–15.5)
WBC: 11.7 10*3/uL — AB (ref 4.0–10.5)

## 2014-03-07 MED ORDER — WARFARIN SODIUM 5 MG PO TABS
5.0000 mg | ORAL_TABLET | Freq: Once | ORAL | Status: AC
  Administered 2014-03-07: 5 mg via ORAL
  Filled 2014-03-07: qty 1
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
## 2014-03-07 NOTE — Care Management Note (Signed)
[facility]patient [facility] Individual Statement of Services  Patient Name:  Danny Hansen  Date:  03/07/2014  Welcome to the [facility]patient [facility].  Our goal is to provide you with an individualized program based on your diagnosis and situation, designed to meet your specific needs.  With this comprehensive rehabilitation program, you will be expected to participate in at least 3 hours of rehabilitation therapies Monday-Friday, with modified therapy programming on the weekends.  Your rehabilitation program will include the following services:  Physical Therapy (PT), Occupational Therapy (OT), 24 hour per day rehabilitation nursing, Therapeutic Recreaction (TR), Case Management (Social Worker), Rehabilitation Medicine, Nutrition Services and Pharmacy Services  Weekly team conferences will be held on Tuesdays to discuss your progress.  Your Social Worker will talk with you frequently to get your input and to update you on team discussions.  Team conferences with you and your family in attendance may also be held.  Expected length of stay: 7 days  Overall anticipated outcome: supervision @ wheelchair level  Depending on your progress and recovery, your program may change. Your Social Worker will coordinate services and will keep you informed of any changes. Your Social Worker's name and contact numbers are listed  below.  The following services may also be recommended but are not provided by the Inpatient Rehabilitation Center:   Driving Evaluations  Home Health Rehabiltiation Services  Outpatient Rehabilitation Services  Vocational Rehabilitation   Arrangements will be made to provide these services after discharge if needed.  Arrangements include referral to agencies that provide these services.  Your insurance has been verified to be:  None (Medicaid and Disability apps to be completed) Your primary doctor is:  None (Child psychotherapist to assist with community health clinic  referral)  Pertinent information will be shared with your doctor and your insurance company.  Social Worker:  [place], [place] [phone] or [phone]   Information discussed with and copy given to patient by: Amada Jupiter, 03/07/2014, 10:03 AM
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
## 2014-03-07 NOTE — Progress Notes (Signed)
Occupational Therapy Session Note  Patient Details  Name: Danny Hansen MRN: [id number] Date of Birth: 13-Feb-1956  Today's Date: 03/07/2014 OT Individual Time: 0700-0800 OT Individual Time Calculation (min): 60 min    Short Term Goals: Week 1:  OT Short Term Goal 1 (Week 1): Focus on LTGs d/t short ELOS  Skilled Therapeutic Interventions/Progress Updates:    Pt sitting in bed finishing breakfast upon arrival.  Pt requested to use toilet prior to washing up.  Pt requested to use toilet in bathroom but I explained to him that he wouldn't be able to access the toilet without standing up.  Pt responded that he couldn't do that because he didn't have a walker.  Reeducated patient on weight bearing precautions.  Pt performed scoot transfer to drop arm BSC with steady A and max verbal cues to maintain weight bearing precautions.  Pt completed bathing tasks sitting EOB after transferring from [employer]  Pt performed lateral leans to bathe buttocks.  Pt still does not have any clothing.  Focus on safety awareness, reeducation of precautions, bed mobility, transfers, and activity tolerance.  Therapy Documentation Precautions:  Precautions Precautions: Posterior Hip;Fall Precaution Comments: NWB LLE, TDWB RLE; Pt able to accurately recall 50% of precautions- throguhout session Required Braces or Orthoses: Other Brace/Splint Other Brace/Splint: bledsoe brace to LLE Restrictions Weight Bearing Restrictions: Yes RLE Weight Bearing: Touchdown weight bearing LLE Weight Bearing: Non weight bearing Pain: Pain Assessment Pain Assessment: No/denies pain Pain Score: 0-No pain  See FIM for current functional status  Therapy/Group: Individual Therapy  [name] 03/07/2014, 10:24 AM

## 2014-03-07 NOTE — Progress Notes (Signed)
Physical Therapy Session Note  Patient Details  Name: Danny Hansen MRN: [id number] Date of Birth: 15-Jan-1956  Today's Date: 03/07/2014 PT Individual Time: Treatment Session 1: 1000-1100; Treatment Session 2: 1330-1400 PT Individual Time Calculation (min): Treatment Session 1: 60 min ; Treatment Session 2:  Short Term Goals: Week 1:  PT Short Term Goal 1 (Week 1): STGs=LTGs due to anticipated LOS  Skilled Therapeutic Interventions/Progress Updates:  1:1. Pt received sitting in w/c, bledsoe brace loosened and pt attempting to pull of all bandages and change them by himself. RN made aware and present to address situation- education that RN only to change bandages, pt verbalized understanding. Focus this session on functional w/c propulsion, w/c management, functional transfers, LE therex and education.   Pt req min A for lateral scoot t/f w/c<>tx mat to guard NWB B LE and t/f sup<>sit EOB with max cues for seq, safety due to impulsivity, t/f set up, completion and management of w/c parts due. Pt continues to demonstrate poor intellectual awareness regarding precautions, pt stating "so when can I put my left leg down, in like 1 week." Extensive review regarding precautions, continued emphasis on B LE NWB for simplicity and to ensure that pt does not put >TTWB through R LE. Pt verbalized understanding.   Pt performed supine therex to target B LE strength, exercises included 2x10 reps of: ankle pumps, L LE quad sets, R LE SAQ, hip abd,(assisted), SLR (assisted), glute sets.   Pt propelled w/c 175' and 400' with overall supervision to target functional endurance, including incline between central and north towers. Pt reported feeling very fatigued at end of session.  Pt left sitting in w/c at end of session w/ all needs in reach and quick release belt in place.   Treatment Session 2:  Pt missed 30 min at start of session due to request for increased time to using bed pan as well as stating  "I'm don't want to get out of bed, I don't want to do any moving because of my pain." Pt noted to be sitting on bed pan w/ B LE crossed, education provided regarding precautions. Pt reporting 6/10 pain in B LE, RN made aware. Pt requesting assistance from nurse tech for clean up vs. PT. PT checking back in later, pt being assisted to bathroom by nurse tech in w/c. PT stepping in to assist pt with safe transfer to [facility] as pt was about to perform anterior t/f to [facility]. Min A for lateral scoot t/f to w/c<>drop arm commode. Supervision for pericare. RN present to provide pain medicine. Supervision for w/c propulsion room>therapy gym. Focus at end of session on review of precautions, written out. Another PT stepping in to continue tx session.   Therapy Documentation Precautions:  Precautions Precautions: Posterior Hip;Fall Precaution Comments: NWB LLE, TDWB RLE; Pt able to accurately recall 50% of precautions- throguhout session Required Braces or Orthoses: Other Brace/Splint Other Brace/Splint: bledsoe brace to LLE Restrictions Weight Bearing Restrictions: Yes RLE Weight Bearing: Touchdown weight bearing LLE Weight Bearing: Non weight bearing Pain: Pain Assessment Pain Assessment: 0-10 Pain Score: 6  Pain Type: Surgical pain Pain Location: Leg Pain Orientation: Left;Right Pain Descriptors / Indicators: Aching Pain Intervention(s): Repositioned;Distraction (Pt reported receiving pain med prior to session)  See FIM for current functional status  Therapy/Group: Individual Therapy  Denzil Hughes 03/07/2014, 11:10 AM

## 2014-03-07 NOTE — Progress Notes (Signed)
Physical Therapy Session Note  Patient Details  Name: Danny Hansen MRN: [id number] Date of Birth: May 31, 1956  Today's Date: 03/07/2014 PT Individual Time: 1400-1430 (Make up tx) PT Individual Time Calculation (min): 30 min   Short Term Goals: Week 1:  PT Short Term Goal 1 (Week 1): STGs=LTGs due to anticipated LOS  Skilled Therapeutic Interventions/Progress Updates:  Tx focused on precaution education, functional mobility, and therex for strengthening. Adjusted Bledsoe brace for proper fit.  Pt feeling frustrated with current level of mobility, wanting to just "lay in bed until I get better." Pt educated on importance of continuing mobility and strengthening for health and conditioning.  Reviewed and provided picture handout of hip precautions, reviewing weight-bearing precautions. Pt able to verbalize 75% of his precautions, and adhered to them throughout.   Pt performed serial WC<>mat transfers for practice with min-guard A overall, protecting LLE throughout. Pt noted to check brakes and safety.  Instructed pt in seated ankle pumps, LAQ, and elevated grip pus-ups 2x10 with rest breaks due to fatigue/pain.   Pt propelled WC x150' with S, good technique;  left up in [facility] with lap belt and all needs in reach.      Therapy Documentation Precautions:  Precautions Precautions: Posterior Hip;Fall Precaution Comments: NWB LLE, TDWB RLE; Pt able to accurately recall 50% of precautions- throguhout session Required Braces or Orthoses: Other Brace/Splint Other Brace/Splint: bledsoe brace to LLE Restrictions Weight Bearing Restrictions: Yes RLE Weight Bearing: Touchdown weight bearing LLE Weight Bearing: Non weight bearing General:   Vital Signs: Therapy Vitals Temp: 99.6 F (37.6 C) Temp src: Oral Pulse Rate: 73 Resp: 18 BP: 126/69 mmHg Patient Position (if appropriate): Sitting Oxygen Therapy SpO2: 100 % O2 Device: None (Room air) Pain: 2/10 bil LEs, provided rest breaks.     Locomotion : Wheelchair Mobility Distance: 150   See FIM for current functional status  Therapy/Group: Individual Therapy Clydene Laming, PT, DPT 03/07/2014, 4:03 PM

## 2014-03-07 NOTE — Progress Notes (Signed)
Patient with elevated temperature of 101.5, 100.7, 100.5. No chills/diaphoresis. Skin warm and dry to touch. Voiding amber color urine from 175-700 ml. Denied discomfort in bladder area. Lung sounds clear upon auscultation. PA. Delle Reining aware. Orders obtained: UA C&S, CXR portable stat, Blood culture x2, CBC. Patient currently in a supine position with eyes close. SR up x3, call bell and personal items within reach. Continue monitoring. Tarah Buboltz A. Ellieana Dolecki, RN
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
## 2014-03-07 NOTE — Progress Notes (Signed)
Social Work Patient ID: Danny Hansen, male   DOB: May 07, 1956, 58 y.o.   MRN: [id number]  Met with pt's sister, Inez Catalina, earlier today who expresses concerns about both nephew and pt's girlfriend being relied on to be caregivers.  She feels neither are capable or reliable to do this.   Met with pt this afternoon and discussed these concerns.  He agrees with these concerns and is open to discussion of SNF.  During our conversation, his girlfriend enters room and adds that she does not feel she can provide care needed at home.  Pt and girlfriend agreed with change of plan to SNF.  Have explained that, as he is pending his Medicaid, it is almost certain that the SNF will be outside of [place] and likely a few hours away.  Pt understands and is in agreement with this SW beginning a bed search.  Have alerted tx team.  Lennart Pall, LCSW

## 2014-03-08 ENCOUNTER — Encounter (HOSPITAL_COMMUNITY): Payer: Self-pay | Admitting: Occupational Therapy

## 2014-03-08 ENCOUNTER — Inpatient Hospital Stay (HOSPITAL_COMMUNITY): Payer: No Typology Code available for payment source | Admitting: Physical Therapy

## 2014-03-08 DIAGNOSIS — F17200 Nicotine dependence, unspecified, uncomplicated: Secondary | ICD-10-CM

## 2014-03-08 DIAGNOSIS — F1020 Alcohol dependence, uncomplicated: Secondary | ICD-10-CM

## 2014-03-08 DIAGNOSIS — F10988 Alcohol use, unspecified with other alcohol-induced disorder: Secondary | ICD-10-CM

## 2014-03-08 LAB — URINE CULTURE

## 2014-03-08 LAB — PROTIME-INR
INR: 2.06 — AB (ref 0.00–1.49)
Prothrombin Time: 23.2 seconds — ABNORMAL HIGH (ref 11.6–15.2)

## 2014-03-08 MED ORDER — WARFARIN SODIUM 2.5 MG PO TABS
2.5000 mg | ORAL_TABLET | Freq: Once | ORAL | Status: AC
  Administered 2014-03-08: 2.5 mg via ORAL
  Filled 2014-03-08: qty 1
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
## 2014-03-08 NOTE — Progress Notes (Signed)
Dressings changed to L anterior thigh wounds/pin sites as ordered. 2 sites- foam dressing removed, cleansed with NS, cut and tucked a piece of aquacel into each wound, and covered with a new foam dressing. Also applied new gauze dressing to open abrasions x2 on R knee, and allevyn to incision on L lateral thigh due to some drainage being present. Other foam dressings on LLE clean and dry, left in place. Mick Sell RN

## 2014-03-08 NOTE — Plan of Care (Signed)
Problem: RH PAIN MANAGEMENT Goal: RH STG PAIN MANAGED AT OR BELOW PT'S PAIN GOAL </=4  Outcome: Progressing Oxy IR  (can have 10-20mg ) q4 prn effective in controlling pain.

## 2014-03-08 NOTE — Progress Notes (Signed)
Occupational Therapy Session Note  Patient Details  Name: Danny Hansen MRN: [id number] Date of Birth: [birth year]  Today's Date: 03/08/2014 OT Individual Time: 1000-1100 OT Individual Time Calculation (min): 60 min    Short Term Goals: Week 1:  OT Short Term Goal 1 (Week 1): Focus on LTGs d/t short ELOS  Skilled Therapeutic Interventions/Progress Updates:  Pt with 8/10 c/o pain in L LE this session. RN notified and medication given. Pt was propelling self down hallway at time of session. Pt propelled self with B UE back to room for ADL session. Pt performed UB bathing seated in wheelchair and LB ADLs supine in bed. Pt performed sliding board transfer towards R side with close supervision. Pt able to verbalize weight bearing restrictions with 100% accuracy this session. Pt performed lateral leans to left and right to remove boxer shorts with close supervision for safety. Pt refusing to put boxer shorts on again this session and reports elastic waist is hurting incision. Pt would only donn 2 hospital gowns after bathing. Draining noted from L hip area and RN notified. Pt requiring rest breaks secondary to increased pain and fatigue this session.  Pt seated in wheelchair with call bell and all needed items within reach.   Therapy Documentation Precautions:  Precautions Precautions: Posterior Hip;Fall Precaution Comments: NWB LLE, TDWB RLE; Pt able to accurately recall 50% of precautions- throguhout session Required Braces or Orthoses: Other Brace/Splint Other Brace/Splint: bledsoe brace to LLE Restrictions Weight Bearing Restrictions: Yes RLE Weight Bearing: Touchdown weight bearing LLE Weight Bearing: Non weight bearing Pain: Pain Assessment Pain Assessment: 0-10 Pain Score: 8  Pain Type: Surgical pain Pain Location: Leg Pain Orientation: Left Pain Descriptors / Indicators: Aching;Sore Pain Frequency: Intermittent Pain Onset: On-going Patients Stated Pain Goal: 2 Pain  Intervention(s): Medication (See eMAR)  See FIM for current functional status  Therapy/Group: Individual Therapy  Lowella Grip 03/08/2014, 12:43 PM

## 2014-03-08 NOTE — Progress Notes (Signed)
Physical Therapy Session Note  Patient Details  Name: Dietrick Barris MRN: [id number] Date of Birth: 1955/12/12  Today's Date: 03/08/2014 PT Individual Time: 0800-0900 Tx 2: 1400-1500 PT Individual Time Calculation (min): 60 min Tx 2: 60 min  Short Term Goals: Week 1:  PT Short Term Goal 1 (Week 1): STGs=LTGs due to anticipated LOS  Skilled Therapeutic Interventions/Progress Updates:    Therapeutic Exercise: PT instructs pt in ROM and strengthening exercises ( L Bledsoe brace on the entire time): ankle pumps, R heel slides, L SLR AAROM, supine hip abduction/adduction to neutral on R, isometric glute sets (2-3 second holds), Hip ER/IR to neutral on R with knee straight, R LAQ: 3 x 10 reps each  Therapeutic Activity: PT instructs pt in supine to long sit propped with arms behind him to follow precautions req SBA and verbal cues, pt req min A for R LE to scoot in this long sit position from bed to w/c with PT placing slideboard and CGA at trunk for safety.   W/C Management: PT instructs pt in self propelling manual w/c x 200' with SBA and verbal cues for B UE propulsion to make w/c go in a straighter line.   Tx 2: Therapeutic Activity: PT instructs pt in bed to w/c transfer via modified long sit method req SBA and verbal cues for technique - pt will bend trunk past 90 degree angle if not reminded.  PT instructs pt in w/c to/from furniture transfer with slideboard req min A due to loveseat being slightly lower than pt's w/c.  PT instructs pt in w/c to/from car transfer with slideboard req min A. Pt reports friend that will pick him up from the hospital drives a [employer], but it is low to the ground. Pt reports he may d/c to a SNF. PT explains that if pt discharges home, then scheduling a transfer session with the friend and the [employer] during PT will be the most appropriate way to ensure he can get in/out of the car; pt verbalizes understanding.   W/C Management: PT instructs pt in w/c propulsion  with B UEs x 150' req SBA. PT instructs pt in ascending/descending ramp with B UE req CGA and verbal cues on first attempt and req close SBA and verbal cues on 2nd attempt.   Pt has very strong B UEs, but demonstrates fatigue with the effort of lifting entire body weight for transfers. In general, pt continues to req verbal cues to follow posterior hip precautions when transferring, but is able to do most transfers with min-SBA, depending on the height differential of surfaces. Pt will benefit from continued open-chain B LE ROM exercises to maintain strength and assist pt in ability to negotiate legs, without req use of B UEs, which make pt tend to want to flex trunk past 90 degrees (break his precaution). Continue per PT POC.    Therapy Documentation Precautions:  Precautions Precautions: Posterior Hip;Fall Precaution Comments: NWB LLE, TDWB RLE; Pt able to accurately recall 50% of precautions- throguhout session Required Braces or Orthoses: Other Brace/Splint Other Brace/Splint: bledsoe brace to LLE Restrictions Weight Bearing Restrictions: Yes RLE Weight Bearing: Touchdown weight bearing LLE Weight Bearing: Non weight bearing Vital Signs: Therapy Vitals Temp: 99.1 F (37.3 C) Temp src: Oral Pulse Rate: 70 Resp: 18 BP: 122/70 mmHg Patient Position (if appropriate): Lying Oxygen Therapy SpO2: 100 % O2 Device: None (Room air) Pain: Pain Assessment Pain Assessment: 0-10 Pain Score: 8  Pain Type: Surgical pain Pain Location: Leg Pain Orientation:  Left Pain Descriptors / Indicators: Aching Pain Frequency: Intermittent Pain Onset: On-going Patients Stated Pain Goal: 2 Pain Intervention(s): Rest;Repositioned Multiple Pain Sites: No Tx 2: Pt denies pain at beginning of treatment.  See FIM for current functional status  Therapy/Group: Individual Therapy  Raven Harmes M 03/08/2014, 8:03 AM
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
## 2014-03-08 NOTE — Plan of Care (Signed)
Problem: RH SAFETY Goal: RH STG ADHERE TO SAFETY PRECAUTIONS W/ASSISTANCE/DEVICE STG Adhere to Safety Precautions With Mod Assistance/Device.  Outcome: Not Progressing Pt transferred self to bed without calling for assistance. Requires bed alarm and quick release belt as reminders for safety.

## 2014-03-09 ENCOUNTER — Inpatient Hospital Stay (HOSPITAL_COMMUNITY): Payer: Self-pay | Admitting: Physical Therapy

## 2014-03-09 ENCOUNTER — Inpatient Hospital Stay (HOSPITAL_COMMUNITY): Payer: Self-pay

## 2014-03-09 DIAGNOSIS — IMO0001 Reserved for inherently not codable concepts without codable children: Secondary | ICD-10-CM

## 2014-03-09 DIAGNOSIS — S7290XB Unspecified fracture of unspecified femur, initial encounter for open fracture type I or II: Secondary | ICD-10-CM

## 2014-03-09 LAB — PROTIME-INR
INR: 2.29 — ABNORMAL HIGH (ref 0.00–1.49)
Prothrombin Time: 25.2 seconds — ABNORMAL HIGH (ref 11.6–15.2)

## 2014-03-09 MED ORDER — WARFARIN SODIUM 2.5 MG PO TABS
2.5000 mg | ORAL_TABLET | Freq: Once | ORAL | Status: AC
  Administered 2014-03-09: 2.5 mg via ORAL
  Filled 2014-03-09: qty 1

## 2014-03-09 NOTE — Progress Notes (Signed)
Physical Therapy Session Note  Patient Details  Name: Kaileb Monsanto MRN: [id number] Date of Birth: [birth year]  Today's Date: 03/09/2014 PT Individual Time: 1300-1400 PT Individual Time Calculation (min): 60 min   Short Term Goals: Week 1:  PT Short Term Goal 1 (Week 1): STGs=LTGs due to anticipated LOS  Skilled Therapeutic Interventions/Progress Updates:    Pt most limited by difficulty carrying over hip precautions during functional activities. Pt also limited by pain throughout. Pt with improved transfer to R compare to L side when coming OOB. Pt cued throughout session for decreased valsalva, requiring constant cues to carryover. Pt would continue to benefit from skilled PT services to increase functional mobility.  Therapy Documentation Precautions:  Precautions Precautions: Posterior Hip;Fall Precaution Comments: NWB LLE, TDWB RLE; Pt able to accurately recall 50% of precautions- throguhout session Required Braces or Orthoses: Other Brace/Splint Other Brace/Splint: bledsoe brace to LLE Restrictions Weight Bearing Restrictions: Yes RLE Weight Bearing: Touchdown weight bearing LLE Weight Bearing: Non weight bearing Vital Signs: Therapy Vitals Temp: 98.3 F (36.8 C) Temp src: Oral Pulse Rate: 73 Resp: 18 BP: 115/52 mmHg Patient Position (if appropriate): Sitting Oxygen Therapy SpO2: 100 % O2 Device: None (Room air) Pain: Pain Assessment Pain Assessment: 0-10 Pain Score: 5  Pain Type: Surgical pain Pain Location: Leg Pain Orientation: Right;Left Pain Descriptors / Indicators: Aching;Sore Pain Frequency: Intermittent Pain Onset: On-going Patients Stated Pain Goal: 2 Pain Intervention(s):  (graded activity, manual) Mobility:  Min A slide board and lateral scoot with cues for hip precautions, safety, hand placement, and technique. Locomotion :   wheelchair mobility 150'x2 supervision with cues for safety and attention. Balance: Static Sitting Balance Static  Sitting - Balance Support: Feet unsupported;No upper extremity supported Static Sitting - Level of Assistance: 5: Stand by assistance Dynamic Sitting Balance Dynamic Sitting - Balance Support: Left upper extremity supported;Right upper extremity supported;Bilateral upper extremity supported Dynamic Sitting - Level of Assistance: 5: Stand by assistance Dynamic Sitting - Balance Activities: Lateral lean/weight shifting;Forward lean/weight shifting;Reaching for objects;Reaching across midline Other Treatments:   Pt performs transfers x5 in session. Pt educated on safety in mobility, variability in training, and rehab plan. Pt performs hip add isometrics, hip abd AROM, glute sets, quad sets, ankle pumps. L SAQs, L heel slide AAROM. 3x10. Pt with RLE ER activation 3x10 following quad and IT band releases. Pt educated on valsalva and ventilation with activities.  See FIM for current functional status  Therapy/Group: Individual Therapy  Christia Reading 03/09/2014, 2:29 PM
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
## 2014-03-09 NOTE — Progress Notes (Signed)
Physical Therapy Session Note  Patient Details  Name: Danny Hansen MRN: [id number] Date of Birth: [birth year]  Today's Date: 03/09/2014 PT Individual Time: 0900-0945 PT Individual Time Calculation (min): 45 min   Short Term Goals: Week 1:  PT Short Term Goal 1 (Week 1): STGs=LTGs due to anticipated LOS  Skilled Therapeutic Interventions/Progress Updates:    Pt received supine in bed, agreeable to participate in therapy. Pt able to recall 2/3 hip precautions independently and 3/3 w/ min verbal cues. Session focused on w/c management, RLE strengthening, functional transfers. Pt moved supine>sit w/ MinA for managing RLE, min VC's for maintaining precautions. Scoot transfer bed>w/c to R, pt instructed therapist in setting up w/c independently. MinA for transfer. Pt propelled w/c w/ S 200' through simulated home environment (ADL apartment) and controlled environment (hospital hallway, therapy gym). MaxA to manage leg rests due to precautions/decreased strength in BLE. Blocked practice scoot transfer x5 mat<>w/c, consistent MinA for maintaining NWB on RLE (pt able to maintain LLE off floor d/t bledsoe brace) and steadying w/c. Sit<>supine on mat table w/ MinA for managing RLE and min VC's to maintain precautions. Supine ex's on mat table 2x10 assisted heel slides, SAQ, and assisted supine hip abd/add (without moving past neutral). Pt propelled w/c 100' back to room, left seated in w/c w/ all needs within reach.  Therapy Documentation Precautions:  Precautions Precautions: Posterior Hip;Fall Precaution Comments: NWB LLE, TDWB RLE; Pt able to accurately recall 50% of precautions- throguhout session Required Braces or Orthoses: Other Brace/Splint Other Brace/Splint: bledsoe brace to LLE Restrictions Weight Bearing Restrictions: Yes RLE Weight Bearing: Touchdown weight bearing LLE Weight Bearing: Non weight bearing General:   Vital Signs: Therapy Vitals Temp: 98.4 F (36.9 C) Temp src:  Oral Pulse Rate: 78 Resp: 18 BP: 115/75 mmHg Patient Position (if appropriate): Lying Oxygen Therapy SpO2: 100 % O2 Device: None (Room air) Pain: Pain Assessment Pain Score: Asleep Mobility:   Locomotion :    Trunk/Postural Assessment :    Balance:   Exercises:   Other Treatments:    See FIM for current functional status  Therapy/Group: Individual Therapy  Hosie Spangle Hosie Spangle, PT, DPT 03/09/2014, 8:00 AM

## 2014-03-09 NOTE — Progress Notes (Signed)
  of Oxy IR given at 2038 & 0358.  +/- sleep last night. Danny Hansen A
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
## 2014-03-10 ENCOUNTER — Inpatient Hospital Stay (HOSPITAL_COMMUNITY): Payer: Self-pay

## 2014-03-10 ENCOUNTER — Encounter (HOSPITAL_COMMUNITY): Payer: Self-pay

## 2014-03-10 DIAGNOSIS — S72409B Unspecified fracture of lower end of unspecified femur, initial encounter for open fracture type I or II: Secondary | ICD-10-CM

## 2014-03-10 DIAGNOSIS — S32409A Unspecified fracture of unspecified acetabulum, initial encounter for closed fracture: Secondary | ICD-10-CM

## 2014-03-10 LAB — PROTIME-INR
INR: 2.06 — ABNORMAL HIGH (ref 0.00–1.49)
PROTHROMBIN TIME: 23.2 s — AB (ref 11.6–15.2)

## 2014-03-10 MED ORDER — WARFARIN SODIUM 5 MG PO TABS
5.0000 mg | ORAL_TABLET | Freq: Once | ORAL | Status: AC
  Administered 2014-03-10: 5 mg via ORAL
  Filled 2014-03-10: qty 1
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
## 2014-03-10 NOTE — Progress Notes (Signed)
Occupational Therapy Session Note  Patient Details  Name: Danny Hansen MRN: [id number] Date of Birth: 23-Jul-1955  Today's Date: 03/10/2014 OT Individual Time: 1300-1400 OT Individual Time Calculation (min): 60 min    Short Term Goals: Week 2:     Skilled Therapeutic Interventions/Progress Updates:    Pt seen for 1:1 OT session with focus on functional transfers, safety awareness, LB self-care, and activity tolerance. Pt received supine in bed and verbalized all precautions. Completed lateral scoot transfer bed<>BSC with min-supervision assist and mod cues to adhering to NWB precautions. Practiced lateral leans for clothing management with pt completing at supervision level and min cues for adherence to precautions. Practiced furniture transfer w/c<>couch with min assist and mod cues for technique. Completed bed mobility and bed<>w/c transfer in ADL apartment to actual bed at supervision level. Upon return to room, pt requesting to use bathroom. Transferred w/c>BSC with steady assist and min cues for setup of w/c. Pt managed clothes down with increased time and supervision assist. Pt left sitting on BSC with RN.   Therapy Documentation Precautions:  Precautions Precautions: Posterior Hip;Fall Precaution Comments: NWB LLE, TDWB RLE; Pt able to accurately recall 50% of precautions- throguhout session Required Braces or Orthoses: Other Brace/Splint Other Brace/Splint: bledsoe brace to LLE Restrictions Weight Bearing Restrictions: Yes RLE Weight Bearing: Touchdown weight bearing LLE Weight Bearing: Non weight bearing General:   Vital Signs: Therapy Vitals Temp: 98.2 F (36.8 C) Temp src: Oral Pulse Rate: 76 Resp: 17 BP: 121/66 mmHg Patient Position (if appropriate): Sitting Oxygen Therapy SpO2: 100 % O2 Device: None (Room air) Pain: Pain Assessment Pain Score: 5  Pain Location: Leg Pain Orientation: Left Pain Descriptors / Indicators: Throbbing Pain Frequency:  Intermittent    See FIM for current functional status  Therapy/Group: Individual Therapy  [name] 03/10/2014, 2:51 PM

## 2014-03-10 NOTE — Plan of Care (Signed)
Problem: RH PAIN MANAGEMENT Goal: RH STG PAIN MANAGED AT OR BELOW PT'S PAIN GOAL < 4  Outcome: Not Progressing Rates pain as 5     

## 2014-03-10 NOTE — Progress Notes (Signed)
Physical Therapy Session Note  Patient Details  Name: Danny Hansen MRN: [id number] Date of Birth: [birth year]  Today's Date: 03/10/2014 PT Individual Time: 1000-1100 PT Individual Time Calculation (min): 60 min   Short Term Goals: Week 1:  PT Short Term Goal 1 (Week 1): STGs=LTGs due to anticipated LOS  Skilled Therapeutic Interventions/Progress Updates:  1:1. Pt received semi-reclined in bed, ready for therapy. Focus this session on functional transfers and therex. Pt req (S) for t/f sup<>sit EOB w/ HOB flat and without use of bed rails, donning socks w/ use of sock aid and reacher. Pt able to verbalized all precautions w/out cues. Supervision-min guard A for lateral scoot t/f bed>w/c<>tx mat, pt able to maintain NWB B LE w/out cues. Pt propelled w/c 500'x1 and 175'x1 to target UE strength/endurance, including slight ramp between central and north zones. Pt propelled w/c in reverse 200'x1 to target triceps. (S) for t/f sup<>sit on tx mat. Pt performed supine therex to target B LE strength/endurance, exercises included 2x10 reps of: ankle pumps, L LE quad set, R LE SAQ, glute sets, hip abd (assisted). Pt denied pain this session, just reporting that B LE feel "uncomfortable." Pt left sitting in w/c w/ all needs in reach, quick release belt in place.   Therapy Documentation Precautions:  Precautions Precautions: Posterior Hip;Fall Precaution Comments: NWB LLE, TDWB RLE; Pt able to accurately recall 50% of precautions- throguhout session Required Braces or Orthoses: Other Brace/Splint Other Brace/Splint: bledsoe brace to LLE Restrictions Weight Bearing Restrictions: Yes RLE Weight Bearing: Touchdown weight bearing LLE Weight Bearing: Non weight bearing   See FIM for current functional status  Therapy/Group: Individual Therapy  Denzil Hughes 03/10/2014, 12:10 PM
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
## 2014-03-10 NOTE — Progress Notes (Signed)
Occupational Therapy Session Note  Patient Details  Name: Danny Hansen MRN: [id number] Date of Birth: 04/23/1956  Today's Date: 03/10/2014 OT Individual Time: 5621-3086 OT Individual Time Calculation (min): 60 min    Short Term Goals: Week 1:  OT Short Term Goal 1 (Week 1): Focus on LTGs d/t short ELOS  Skilled Therapeutic Interventions/Progress Updates:    Pt engaged in bathing tasks seated EOB with focus on lateral leans to bathe buttocks.  Pt issued AE to assist with bathing and dressing tasks.  Pt demonstrated proficiency with use of sock aid and reacher for LB dressing.  Pt still does not have clothing but did don underpants.  Pt continues to demonstrate slight impulsivity with transitional movements and requires min verbal cues to maintain hip precautions.    Therapy Documentation Precautions:  Precautions Precautions: Posterior Hip;Fall Precaution Comments: NWB LLE, TDWB RLE; Pt able to accurately recall 50% of precautions- throguhout session Required Braces or Orthoses: Other Brace/Splint Other Brace/Splint: bledsoe brace to LLE Restrictions Weight Bearing Restrictions: Yes RLE Weight Bearing: Touchdown weight bearing LLE Weight Bearing: Non weight bearing Pain: 5/10 left hip; RN aware and repositioned  See FIM for current functional status  Therapy/Group: Individual Therapy  [name] 03/10/2014, 8:00 AM

## 2014-03-10 NOTE — Progress Notes (Signed)
Distal old, pin site on left shin continues to drain. Cleaned and applied new allevyn dressing.  Oxy IR given at 1944 with relief. Encouraged patient to use I.S.Danny Hansen A

## 2014-03-11 ENCOUNTER — Inpatient Hospital Stay (HOSPITAL_COMMUNITY): Payer: Self-pay

## 2014-03-11 ENCOUNTER — Encounter (HOSPITAL_COMMUNITY): Payer: Self-pay

## 2014-03-11 DIAGNOSIS — S32409A Unspecified fracture of unspecified acetabulum, initial encounter for closed fracture: Secondary | ICD-10-CM

## 2014-03-11 DIAGNOSIS — S72409B Unspecified fracture of lower end of unspecified femur, initial encounter for open fracture type I or II: Secondary | ICD-10-CM

## 2014-03-11 LAB — CULTURE, BLOOD (ROUTINE X 2)
CULTURE: NO GROWTH
Culture: NO GROWTH

## 2014-03-11 LAB — PROTIME-INR
INR: 1.92 — ABNORMAL HIGH (ref 0.00–1.49)
Prothrombin Time: 22 seconds — ABNORMAL HIGH (ref 11.6–15.2)

## 2014-03-11 MED ORDER — WARFARIN SODIUM 5 MG PO TABS
5.0000 mg | ORAL_TABLET | Freq: Once | ORAL | Status: AC
  Administered 2014-03-11: 5 mg via ORAL
  Filled 2014-03-11: qty 1

## 2014-03-11 NOTE — Plan of Care (Signed)
Problem: RH Stairs Goal: LTG Patient will ambulate up and down stairs w/assist (PT) LTG: Patient will ambulate up and down # of stairs with assistance (PT)  Outcome: Not Applicable Date Met:  54/00/86 Goal d/c as pt plans to d/c to SNF.
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
## 2014-03-11 NOTE — Plan of Care (Signed)
Problem: RH PAIN MANAGEMENT Goal: RH STG PAIN MANAGED AT OR BELOW PT'S PAIN GOAL < 4  Outcome: Not Progressing Rates pain as 6     

## 2014-03-11 NOTE — Progress Notes (Signed)
Occupational Therapy Session Note  Patient Details  Name: Danny Hansen MRN: [id number] Date of Birth: 12-10-1955  Today's Date: 03/11/2014 OT Individual Time: 0700-0800 OT Individual Time Calculation (min): 60 min    Short Term Goals: Week 1:  OT Short Term Goal 1 (Week 1): Focus on LTGs d/t short ELOS  Skilled Therapeutic Interventions/Progress Updates:    Pt engaged in bathing tasks at bed level with supervision.  Pt transferred bed<>drop arm BSC with supervision.  Pt requires assistance with clothing management on BSC.  Pt fatigues quickly and requires multiple rest breaks throughout session.  Focus on activity tolerance, transfers, bed mobility, clothing management, and safety awareness.  Therapy Documentation Precautions:  Precautions Precautions: Posterior Hip;Fall Precaution Comments: NWB LLE, TDWB RLE; Pt able to accurately recall 50% of precautions- throguhout session Required Braces or Orthoses: Other Brace/Splint Other Brace/Splint: bledsoe brace to LLE Restrictions Weight Bearing Restrictions: Yes RLE Weight Bearing: Touchdown weight bearing LLE Weight Bearing: Non weight bearing Pain: Pain Assessment Pain Assessment: 0-10 Pain Score: 6  Pain Type: Acute pain;Surgical pain Pain Location: Leg Pain Orientation: Left Pain Descriptors / Indicators: Aching Pain Onset: With Activity Pain Intervention(s): RN made aware;Repositioned  See FIM for current functional status  Therapy/Group: Individual Therapy  [name] 03/11/2014, 8:08 AM

## 2014-03-11 NOTE — Progress Notes (Signed)
Occupational Therapy Session Note  Patient Details  Name: Danny Hansen MRN: [id number] Date of Birth: 07-17-1955  Today's Date: 03/11/2014 OT Individual Time: 1300-1400 OT Individual Time Calculation (min): 60 min    Short Term Goals: Week 1:  OT Short Term Goal 1 (Week 1): Focus on LTGs d/t short ELOS  Skilled Therapeutic Interventions/Progress Updates:    Pt resting in bed upon arrival and agreeable to participating in therapy.  Pt performed squat pivot transfer to w/c and propelled to therapy gym.  Pt initially engaged in BUE therex on UE Ergometer (7 mins in both directions on Random cylce @ workload 1).  Pt required rest break after approx 3 mins before he could complete cycle.  Pt required rest break X 4 mins between therex.  Pt propelled to day room and ADL apartment to practice w/c mobility.  Pt requires extra time to navigate in tight environment.  Pt propelled to room and transferred to bed.  Focus on increased activity tolerance/endurance, bed mobility, transfers, and w/c mobility.  Therapy Documentation Precautions:  Precautions Precautions: Posterior Hip;Fall Precaution Comments: NWB LLE, TDWB RLE; Pt able to accurately recall 50% of precautions- throguhout session Required Braces or Orthoses: Other Brace/Splint Other Brace/Splint: bledsoe brace to LLE Restrictions Weight Bearing Restrictions: Yes RLE Weight Bearing: Touchdown weight bearing LLE Weight Bearing: Non weight bearing   Pain: Pain Assessment Pain Assessment: 0-10 Pain Score: 6  Pain Type: Acute pain Pain Location: Leg Pain Orientation: Left Pain Descriptors / Indicators: Aching Pain Onset: With Activity Pain Intervention(s): RN made aware;Repositioned  See FIM for current functional status  Therapy/Group: Individual Therapy  [name] 03/11/2014, 2:10 PM
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
## 2014-03-11 NOTE — Progress Notes (Signed)
Physical Therapy Session Note  Patient Details  Name: Danny Hansen MRN: [id number] Date of Birth: Oct 22, 1955  Today's Date: 03/11/2014 PT Individual Time: 0800-0900 PT Individual Time Calculation (min): 60 min   Short Term Goals: Week 1:  PT Short Term Goal 1 (Week 1): STGs=LTGs due to anticipated LOS  Skilled Therapeutic Interventions/Progress Updates:  1:1. Pt received semi-reclined in bed, ready for therapy. Focus this session on functional transfers, functional w/c propulsion in controlled and community environments and B LE therex. Pt able to t/f sup>sit EOB w/ HOB flat and w/out bed rails, min A for donning pants via lateral leans, supervision for lateral scoot t/f bed>w/c<>tx mat. Pt appropriately maintaining WB precautions throughout. Pt propelled w/c 200'x1 and 175'x2 on unit as well as 400'x2and 300'x1 in community environments including on/off elevators, in busy hospital lobby, outside on uneven surfaces and up/down ramp(introduced to forwards and reverse directions) w/ overall supervision. Pt continues to require min A for donning/doffing leg rests due to R hip precautions, but able to easily swinging leg rests in/out w/ supervision and min cues.  Supervision for t/f sup<>sit on tx mat. Pt performed supine therex to target B LE strength/endurance, exercises included 2x10 reps of: R heel slides, R SAQ, L quad sets, glute sets. Pt reporting minimal pain this session, reported receiving pain meds prior to start of session. Pt left sitting in w/c at end of session w/ all needs in reach, quick release belt in place.   Therapy Documentation Precautions:  Precautions Precautions: Posterior Hip;Fall Precaution Comments: NWB LLE, TDWB RLE; Pt able to accurately recall 50% of precautions- throguhout session Required Braces or Orthoses: Other Brace/Splint Other Brace/Splint: bledsoe brace to LLE Restrictions Weight Bearing Restrictions: Yes RLE Weight Bearing: Touchdown weight bearing LLE  Weight Bearing: Non weight bearing  See FIM for current functional status  Therapy/Group: Individual Therapy  Denzil Hughes 03/11/2014, 9:13 AM

## 2014-03-12 ENCOUNTER — Inpatient Hospital Stay (HOSPITAL_COMMUNITY): Payer: Self-pay

## 2014-03-12 ENCOUNTER — Encounter (HOSPITAL_COMMUNITY): Payer: Self-pay

## 2014-03-12 DIAGNOSIS — S32409A Unspecified fracture of unspecified acetabulum, initial encounter for closed fracture: Secondary | ICD-10-CM

## 2014-03-12 DIAGNOSIS — S72409B Unspecified fracture of lower end of unspecified femur, initial encounter for open fracture type I or II: Secondary | ICD-10-CM

## 2014-03-12 LAB — PROTIME-INR
INR: 2.22 — ABNORMAL HIGH (ref 0.00–1.49)
Prothrombin Time: 24.6 seconds — ABNORMAL HIGH (ref 11.6–15.2)

## 2014-03-12 MED ORDER — WARFARIN SODIUM 2.5 MG PO TABS
2.5000 mg | ORAL_TABLET | Freq: Once | ORAL | Status: AC
  Administered 2014-03-12: 2.5 mg via ORAL
  Filled 2014-03-12: qty 1
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
## 2014-03-12 NOTE — Progress Notes (Signed)
Occupational Therapy Session Note  Patient Details  Name: Danny Hansen MRN: [id number] Date of Birth: Oct 21, 1955  Today's Date: 03/12/2014 OT Individual Time: 0700-0800 OT Individual Time Calculation (min): 60 min    Short Term Goals: Week 1:  OT Short Term Goal 1 (Week 1): Focus on LTGs d/t short ELOS  Skilled Therapeutic Interventions/Progress Updates:    Pt engaged in bathing and dressing tasks seated EOB. Focus on bed mobility, lateral leans, safety awareness/precautions, AE use, and activity tolerance.  Pt used AE appropriately.  Pt continues to require assistance pulling pants over hips.  Pt exhibits difficulty performing lateral leans secondary to increased pain in bilateral hips when performing task.   Therapy Documentation Precautions:  Precautions Precautions: Posterior Hip;Fall Precaution Comments: NWB LLE, TDWB RLE; Pt able to accurately recall 50% of precautions- throguhout session Required Braces or Orthoses: Other Brace/Splint Other Brace/Splint: bledsoe brace to LLE Restrictions Weight Bearing Restrictions: Yes RLE Weight Bearing: Touchdown weight bearing LLE Weight Bearing: Non weight bearing Pain: Pain Assessment Pain Assessment: 0-10 Pain Score: 3  Pain Type: Acute pain Pain Location: Leg Pain Orientation: Left;Right Pain Descriptors / Indicators: Aching Pain Onset: On-going Pain Intervention(s): Repositioned;Rest;Distraction (Pt reported receiving pain med prior to session)  See FIM for current functional status  Therapy/Group: Individual Therapy  [name] 03/12/2014, 12:16 PM

## 2014-03-12 NOTE — Progress Notes (Addendum)
ANTICOAGULATION CONSULT NOTE - Follow Up Consult  Pharmacy Consult for coumadin Indication: VTE prophylaxis  And DVT   No Known Allergies  Patient Measurements: Height:  (167.6 cm) Weight: 143 lb 4.8 oz (65 kg) IBW/kg (Calculated) : 63.8 Heparin Dosing Weight:   Vital Signs: Temp: 98.9 F (37.2 C) (09/02 0601) Temp src: Oral (09/02 0601) BP: 117/71 mmHg (09/02 0601) Pulse Rate: 62 (09/02 0601)  Labs:  Recent Labs  03/10/14 0825 03/11/14 0702 03/12/14 0634  LABPROT 23.2* 22.0* 24.6*  INR 2.06* 1.92* 2.22*    Estimated Creatinine Clearance: 93 ml/min (by C-G formula based on Cr of 0.62).   Medications:  Scheduled:  . bacitracin   Topical BID  . docusate sodium  100 mg Oral BID  . ferrous sulfate  325 mg Oral TID PC  . folic acid  1 mg Oral Daily  . multivitamin with minerals  1 tablet Oral Daily  . polyethylene glycol  17 g Oral Daily  . senna  1 tablet Oral BID  . thiamine  100 mg Oral Daily   Or  . thiamine  100 mg Intravenous Daily  . Warfarin - Pharmacist Dosing Inpatient   Does not apply q1800   Infusions:    Assessment: 58 yo male s/p ortho surgery with DVT is currently on therapeutic coumadin.  INR today is up to 2.22 from 1.92.  Goal of Therapy:  INR 2-3 Monitor platelets by anticoagulation protocol: Yes   Plan:  Coumadin 2.5 mg po x1 (may need 5 mg TThSa and 2.5mg  other days; had 5 mg for 3 straight days previously and INR shot up) INR in am   Kylle Lall, Tsz-Yin 03/12/2014,8:09 AM

## 2014-03-12 NOTE — Plan of Care (Signed)
Problem: RH Car Transfers Goal: LTG Patient will perform car transfers with assist (PT) LTG: Patient will perform car transfers with assistance (PT).  Outcome: Not Applicable Date Met:  22/44/97 Goal d/c, pt d/c to SNF.

## 2014-03-12 NOTE — Progress Notes (Signed)
Occupational Therapy Weekly Progress Note  Patient Details  Name: Danny Hansen MRN: [id number] Date of Birth: Nov 27, 1955  Beginning of progress report period: March 05, 2014 End of progress report period: March 12, 2014  Pt has made steady progress with BADLs and transfers since admission.  Pt continues to require min A for clothing management utilizing lateral leans.  Pt continues to experience increased pain when performing lateral lean to right and requires assistance pulling up pants over hips.  Pt continues to require min/mod verbal cues for hip precautions when engaged in functional task.  Pt fatigues quickly and required extra time and multiple rest breaks during therapy sessions.    Patient continues to demonstrate the following deficits: decreased activity tolerance, decreased safety awareness, decreased strength, decreased emergent and anticipatory awareness, decreased coordination and therefore will continue to benefit from skilled OT intervention to enhance overall performance with BADLs, activty tolerance, overall awareness, and ability to compensate for deficits.  Patient progressing toward long term goals..  Continue plan of care.  OT Short Term Goals Week 2:  OT Short Term Goal 1 (Week 2): Pt will perform clothing management seated on BSC utilizing lateral leans at supervision level OT Short Term Goal 2 (Week 2): Pt will demonstrate emergent awareness with supervison   Therapy Documentation Precautions:  Precautions Precautions: Posterior Hip;Fall Precaution Comments: NWB LLE, TDWB RLE; Pt able to accurately recall 50% of precautions- throguhout session Required Braces or Orthoses: Other Brace/Splint Other Brace/Splint: bledsoe brace to LLE Restrictions Weight Bearing Restrictions: Yes RLE Weight Bearing: Touchdown weight bearing LLE Weight Bearing: Non weight bearing  See FIM for current functional status   [name] 03/12/2014, 3:16 PM

## 2014-03-12 NOTE — Progress Notes (Addendum)
Physical Therapy Weekly Progress Note  Patient Details  Name: Danny Hansen MRN: [id number] Date of Birth: Feb 23, 1956  Beginning of progress report period: March 05, 2014 End of progress report period: March 12, 2014  Today's Date: 03/12/2014 PT Individual Time: 1010-1100 PT Individual Time Calculation (min): 50 min   Pt has made steady progress since admission and has achieved 4/8 LTGs. Pt currently req supervision-min guard A for safety with min cueing during functional transfers to maintain precautions, supervision for w/c propulsion up to 400'. Pt demonstrates decreased functional endurance, req frequent but short rest breaks during sessions. Pt demonstrates intellectual awareness of WB precautions, but continues to demonstrate decreased emergent and anticipatory awareness regarding precautions with pt frequently asking, "so I'll be able to walk again soon right." Pt's family unable to provide recommended 24hr supervision due to decreased overall safety awareness, d/c plan has now changed to SNF.   Patient continues to demonstrate the following deficits: decreased strength, decreased functional endurance, decreased emergent/anticipatory awareness, decreased safety awareness and therefore will continue to benefit from skilled PT intervention to enhance overall performance with activity tolerance, ability to compensate for deficits, awareness and knowledge of precautions.  See Patient's Care Plan for progression toward long term goals.  Patient progressing toward long term goals..  Continue plan of care.  Skilled Therapeutic Interventions/Progress Updates:  1:1. Pt received sitting on BSC, requesting increased time for use. Focus this session on functional endurance, functional transfers, emergent awareness of precautions and functional w/c propulsion. Pt reported 3/10 pain in B LE, but received pain medicine prior to start of session. Pt mod(I) for pericare, req supervision for lateral  scoot t/f off of commode>bed>w.c. Pt propelled w/c 175'x2 on unit at mod(I) level, supervision for 100'x2 and 75'x2 off unit in home environment area. Pt practiced multiple furniture transfers in solarium, req supervision-min A for management of single/B LE during uneven surface transfers w/ min cues for maintaining WB precautions and increased time for problem solving of set up including w/c part management. Pt req rest breaks throughout session due to fatigue. Pt able to recall all WB and posterior hip precautions w/ out cues. Pt left sitting in w/c at end of session w/ all needs in reach, quick release belt in place.   Pt missed 10 min at start of session due to toileting.   Therapy Documentation Precautions:  Precautions Precautions: Posterior Hip;Fall Precaution Comments: NWB LLE, TDWB RLE; Pt able to accurately recall 50% of precautions- throguhout session Required Braces or Orthoses: Other Brace/Splint Other Brace/Splint: bledsoe brace to LLE Restrictions Weight Bearing Restrictions: Yes RLE Weight Bearing: Touchdown weight bearing LLE Weight Bearing: Non weight bearing  See FIM for current functional status  Therapy/Group: Individual Therapy  Denzil Hughes 03/12/2014, 4:14 PM
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
## 2014-03-12 NOTE — Progress Notes (Signed)
Social Work Patient ID: Danny Hansen, male   DOB: 12/25/1955, 58 y.o.   MRN: [id number]  Have reviewed team conference with pt.  Aware that I am continuing to pursue SNF.  Have a couple of facilities expressing interest and hope to have bed secured this week.  Will keep staff and pt posted.  Bane Hagy, LCSW

## 2014-03-12 NOTE — Plan of Care (Signed)
Problem: RH Car Transfers Goal: LTG Patient will perform car transfers with assist (PT) LTG: Patient will perform car transfers with assistance (PT).  Outcome: Not Met (add Reason) Goal d/c as pt plans to d/c to SNF.

## 2014-03-12 NOTE — Progress Notes (Signed)
Occupational Therapy Session Note  Patient Details  Name: Danny Hansen MRN: [id number] Date of Birth: 1955/08/15  Today's Date: 03/12/2014 OT Individual Time: 1300-1400 OT Individual Time Calculation (min): 60 min    Short Term Goals: Week 1:  OT Short Term Goal 1 (Week 1): Focus on LTGs d/t short ELOS  Skilled Therapeutic Interventions/Progress Updates:    Pt initially engaged in bed mobility and transfers with focus on adhering to weight bearing precautions.  Pt continues to require min verbal cues for w/c set up and following hip precautions.  Pt propelled to therapy gym and engaged in BUE therex including UE Ergometer and 6# weights.  Pt completed 2 sets @ 8 mins on work load 2 Random (both directions). Pt transitioned to shoulder presses, tricep presses and chest presses with 6# weights.  Pt propelled back to room and remained in w/c with QRB in place and all needs within reach.   Therapy Documentation Precautions:  Precautions Precautions: Posterior Hip;Fall Precaution Comments: NWB LLE, TDWB RLE; Pt able to accurately recall 50% of precautions- throguhout session Required Braces or Orthoses: Other Brace/Splint Other Brace/Splint: bledsoe brace to LLE Restrictions Weight Bearing Restrictions: Yes RLE Weight Bearing: Touchdown weight bearing LLE Weight Bearing: Non weight bearing Pain Assessment Pain Assessment: 0-10 Pain Score: 3  Pain Type: Acute pain Pain Location: Leg Pain Orientation: Left;Right Pain Descriptors / Indicators: Aching Pain Onset: On-going Pain Intervention(s): Repositioned;Rest;Distraction (Pt reported receiving pain med prior to session)  See FIM for current functional status  Therapy/Group: Individual Therapy  [name] 03/12/2014, 2:31 PM

## 2014-03-13 ENCOUNTER — Inpatient Hospital Stay (HOSPITAL_COMMUNITY): Payer: Self-pay | Admitting: Occupational Therapy

## 2014-03-13 ENCOUNTER — Inpatient Hospital Stay (HOSPITAL_COMMUNITY): Payer: No Typology Code available for payment source

## 2014-03-13 ENCOUNTER — Inpatient Hospital Stay (HOSPITAL_COMMUNITY): Payer: Self-pay | Admitting: Speech Pathology

## 2014-03-13 ENCOUNTER — Encounter (HOSPITAL_COMMUNITY): Payer: Self-pay

## 2014-03-13 LAB — PROTIME-INR
INR: 1.87 — AB (ref 0.00–1.49)
Prothrombin Time: 21.5 seconds — ABNORMAL HIGH (ref 11.6–15.2)

## 2014-03-13 LAB — CULTURE, BLOOD (ROUTINE X 2)
Culture: NO GROWTH
Culture: NO GROWTH

## 2014-03-13 MED ORDER — WARFARIN SODIUM 5 MG PO TABS
5.0000 mg | ORAL_TABLET | Freq: Once | ORAL | Status: AC
  Administered 2014-03-13: 5 mg via ORAL
  Filled 2014-03-13: qty 1

## 2014-03-13 NOTE — Progress Notes (Signed)
Occupational Therapy Session Note  Patient Details  Name: Danny Hansen MRN: [id number] Date of Birth: Jul 17, 1955  Today's Date: 03/13/2014 OT Individual Time: 1000-1100 OT Individual Time Calculation (min): 60 min    Short Term Goals: Week 2:  OT Short Term Goal 1 (Week 2): Pt will perform clothing management seated on BSC utilizing lateral leans at supervision level OT Short Term Goal 2 (Week 2): Pt will demonstrate emergent awareness with supervison  Skilled Therapeutic Interventions/Progress Updates:    Pt resting in bed upon arrival.  Pt stated he had already "washed up" and changed clothing.  Pt did state he wanted to wash his left leg because "they" were going to change his dressings.  Pt required max verbal cues to maintain hip precautions while engaged in cleaning his LLE.  Pt assisted with doffing and donning Bledsoe brace.  Pt performed bed mobility and bed->w/c transfer at supervision level.  Pt propelled to therapy gym and engaged in BUE therex on UE ergometer (9 min @ random wokload 3 X 2 in both directions). Pt propelled back to room and remained in w/c with QRB in place and all needs within reach.  Focus on bed mobility, transfers, and BUE therex.  Therapy Documentation Precautions:  Precautions Precautions: Posterior Hip;Fall Precaution Comments: NWB LLE, TDWB RLE; Pt able to accurately recall 50% of precautions- throguhout session Required Braces or Orthoses: Other Brace/Splint Other Brace/Splint: bledsoe brace to LLE Restrictions Weight Bearing Restrictions: Yes RLE Weight Bearing: Touchdown weight bearing LLE Weight Bearing: Non weight bearing General:   Vital Signs:   Pain:  Pt denied pain this morning  See FIM for current functional status  Therapy/Group: Individual Therapy  [name] 03/13/2014, 11:03 AM
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
## 2014-03-13 NOTE — Progress Notes (Signed)
Physical Therapy Discharge Summary  Patient Details  Name: Danny Hansen MRN: [id number] Date of Birth: 1956/06/22  Today's Date: 03/13/2014 PT Individual Time: 0755-0855 PT Individual Time Calculation (min): 60 min    Patient has met 6 of 7 long term goals due to improved activity tolerance, improved balance, increased strength and decreased pain.  Patient to discharge at a wheelchair level Supervision.   Patient does not have necessary physical assistance at home post discharge. Patient discharging to SNF until weight bearing restrictions are lifted.  Reasons goals not met: Patient did not meet family bumping up/down steps due to family education not completed.  Recommendation:  Patient will benefit from ongoing skilled PT services in skilled nursing facility setting to continue to advance safe functional mobility, address ongoing impairments in mobility and LE strengthening, and minimize fall risk.  Equipment: No equipment provided due to SNF discharge  Reasons for discharge: discharge from hospital  Skilled Intervention: Patient sitting on edge of bed completing bathing and dressing. Patient completed with supervision and set up assist to obtain objects using sock aide and reacher. Patient able to verbalize all hip and weight bearing precautions. Patient transferred bed to wheelchair scooting with supervision. Patient also able to work wheelchair parts. Patient also demonstrated with regular double bed including supine <> sit. Patient uses UE's to move LE's on/off bed and leg rests of wheelchair. Patient propelled wheelchair up/down 5 feet ramp x 2 with supervision. Patient transferred to mat and performed active/active assistive exercise x 20 reps of heel slides, SAQ's, and quad sets on right and hip abduction/adduction, ankle pumps, and SLR's bilaterally. Patient independent with wheelchair mobility on unit. Patient left in wheelchair with all items in reach.  Patient/family agrees  with progress made and goals achieved: Yes  PT Discharge Precautions/Restrictions Precautions Precautions: Posterior Hip;Fall Precaution Comments: NWB LLE, TDWB RLE; Pt able to accurately recall 100% of precautions- throguhout session Required Braces or Orthoses: Other Brace/Splint (bledsoe brace left LE) Restrictions Weight Bearing Restrictions: Yes RLE Weight Bearing: Touchdown weight bearing LLE Weight Bearing: Non weight bearing Vital Signs Therapy Vitals Temp: 98.7 F (37.1 C) Temp src: Oral Pulse Rate: 83 Resp: 18 BP: 118/71 mmHg Patient Position (if appropriate): Sitting Oxygen Therapy SpO2: 100 % O2 Device: None (Room air) Pain Pain Assessment Pain Assessment: 0-10 Pain Score: 3  Pain Type: Acute pain Pain Location: Leg Pain Orientation: Right Pain Descriptors / Indicators: Aching Pain Intervention(s): Medication (See eMAR)  Cognition Overall Cognitive Status: Within Functional Limits for tasks assessed Arousal/Alertness: Awake/alert Orientation Level: Oriented X4 Sensation Sensation Light Touch: Impaired Detail Additional Comments: patient reports some areas of numbness in left LE Coordination Gross Motor Movements are Fluid and Coordinated: Yes Fine Motor Movements are Fluid and Coordinated: Yes Motor  Motor Motor: Within Functional Limits  Mobility Bed Mobility Bed Mobility: Rolling Right;Rolling Left;Supine to Sit;Sit to Supine Rolling Right: 5: Supervision Rolling Left: 5: Supervision Supine to Sit: 5: Supervision Sit to Supine: 5: Supervision Transfers Transfers: Yes Lateral/Scoot Transfers: 5: Supervision Lateral/Scoot Transfer Details (indicate cue type and reason): Patient used UE's to assist with placement of LE's  during transfer Locomotion  Ambulation Ambulation: No Gait Gait: No Stairs / Additional Locomotion Stairs: No Ramp: 5: Psychiatric nurse: Yes Wheelchair Assistance: 6: Modified  independent (Device/Increase time) Environmental health practitioner: Both upper extremities Wheelchair Parts Management: Independent Distance: 150  Balance Static Sitting Balance Static Sitting - Balance Support: Feet unsupported;No upper extremity supported Static Sitting - Level of Assistance: 7: Independent  Extremity Assessment  RUE Assessment RUE Assessment: Within Functional Limits LUE Assessment LUE Assessment: Within Functional Limits RLE Assessment RLE Assessment: Exceptions to [facility] RLE Strength RLE Overall Strength Comments: limited by pain - grossly 3-/5 throughout right LE LLE Assessment LLE Assessment: Exceptions to [facility] LLE Strength LLE Overall Strength: Due to precautions;Deficits;Due to pain LLE Overall Strength Comments: limited by pain; grossly 2+/5 at hip for flexion, abduction, and adduction  See FIM for current functional status  Elder Love M 03/13/2014, 3:44 PM
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
## 2014-03-13 NOTE — Progress Notes (Signed)
Occupational Therapy Session Note  Patient Details  Name: Danny Hansen MRN: [id number] Date of Birth: 04-19-1956  Today's Date: 03/13/2014 OT Individual Time: 1330-1430 OT Individual Time Calculation (min): 60 min    Skilled Therapeutic Interventions/Progress Updates:    Pt began session by working on toileting and toilet transfers.  He was able to scoot over to the drop arm commode with supervision as well as performing his own clothing management and hygiene with supervision in sitting as well.  He scooted back around to the bed to wash his peri area with lateral leaning and to pull his pants up .  Transfer to the wheelchair with supervision scoot pivot.  He then pushed himself down to the gym with modified independence to perform UE therex exercises listed below.  Min instructional cueing to perform all exercises.  Therapy Documentation Precautions:  Precautions Precautions: Posterior Hip;Fall Precaution Comments: NWB LLE, TDWB RLE; Pt able to accurately recall 100% of precautions- throguhout session Required Braces or Orthoses: Other Brace/Splint Other Brace/Splint: bledsoe brace to LLE Restrictions Weight Bearing Restrictions: Yes RLE Weight Bearing: Touchdown weight bearing LLE Weight Bearing: Non weight bearing  Vital Signs: Therapy Vitals Temp: 98.7 F (37.1 C) Temp src: Oral Pulse Rate: 83 Resp: 18 BP: 118/71 mmHg Patient Position (if appropriate): Sitting Oxygen Therapy SpO2: 100 % O2 Device: None (Room air) Pain: Pain Assessment Pain Assessment: 0-10 Pain Score: 3  Pain Type: Acute pain Pain Location: Leg Pain Orientation: Right Pain Descriptors / Indicators: Aching Pain Intervention(s): Medication (See eMAR)  Exercises: General Exercises - Upper Extremity Shoulder Flexion: Strengthening;Both;15 reps;Seated;Theraband Theraband Level (Shoulder Flexion): Level 3 (Green) Shoulder ABduction: Strengthening;Both;10 reps;Seated;Theraband Theraband Level  (Shoulder Abduction): Level 3 (Green) Shoulder ADduction: AROM;Strengthening;Both;15 reps;Seated;Theraband Theraband Level (Shoulder Adduction): Level 3 (Green) Elbow Flexion: Strengthening;15 reps;Seated;Theraband Theraband Level (Elbow Flexion): Level 3 (Green) Elbow Extension: AROM;Strengthening;15 reps;Seated;Theraband Theraband Level (Elbow Extension): Level 3 (Green)   Therapy/Group: Individual Therapy  Steve Gregg OTR/L 03/13/2014, 4:58 PM
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
## 2014-03-13 NOTE — Progress Notes (Signed)
Social Work Patient ID: Danny Hansen, male   DOB: [birth year], 58 y.o.   MRN: [id number]   Received SNF bed offer today from [facility] who can admit pt tomorrow.  Have reviewed with pt and his sister, [name] and both accepting bed offer.  Have alerted tx team and will coordinate transportation for tomorrow.  Sherolyn Trettin, LCSW

## 2014-03-13 NOTE — Discharge Summary (Signed)
Discharge summary job # [id number]

## 2014-03-14 LAB — PROTIME-INR
INR: 1.95 — ABNORMAL HIGH (ref 0.00–1.49)
PROTHROMBIN TIME: 22.2 s — AB (ref 11.6–15.2)

## 2014-03-14 MED ORDER — WARFARIN SODIUM 5 MG PO TABS
5.0000 mg | ORAL_TABLET | Freq: Once | ORAL | Status: DC
Start: 2014-03-14 — End: 2014-03-14
  Filled 2014-03-14: qty 1

## 2014-03-14 NOTE — Progress Notes (Signed)
ANTICOAGULATION CONSULT NOTE - Follow Up Consult  Pharmacy Consult for coumadin Indication: DVT / VTE prophylaxis  No Known Allergies  Patient Measurements: Height:  (167.6 cm) Weight: 160 lb 11.2 oz (72.893 kg) IBW/kg (Calculated) : 63.8 Heparin Dosing Weight:   Vital Signs: Temp: 98.5 F (36.9 C) (09/04 0500) Temp src: Oral (09/04 0500) BP: 163/81 mmHg (09/04 0500) Pulse Rate: 78 (09/04 0500)  Labs:  Recent Labs  03/12/14 0634 03/13/14 0802 03/14/14 0223  LABPROT 24.6* 21.5* 22.2*  INR 2.22* 1.87* 1.95*    Estimated Creatinine Clearance: 93 ml/min (by C-G formula based on Cr of 0.62).   Medications:  Scheduled:  . bacitracin   Topical BID  . docusate sodium  100 mg Oral BID  . ferrous sulfate  325 mg Oral TID PC  . folic acid  1 mg Oral Daily  . multivitamin with minerals  1 tablet Oral Daily  . polyethylene glycol  17 g Oral Daily  . senna  1 tablet Oral BID  . thiamine  100 mg Oral Daily   Or  . thiamine  100 mg Intravenous Daily  . Warfarin - Pharmacist Dosing Inpatient   Does not apply q1800   Infusions:    Assessment: 58 yo male with DVT s/p ortho surgery is currently on slightly subtherapeutic coumadin.  INR today is 1.95. Goal of Therapy:  INR 2-3 Monitor platelets by anticoagulation protocol: Yes   Plan:  - coumadin  po x1 - INR in am  Lenisha Lacap, Tsz-Yin 03/14/2014,8:20 AM
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
## 2014-03-14 NOTE — Progress Notes (Signed)
Social Work  Discharge Note  The overall goal for the admission was met for:   Discharge location: No - plan changed to SNF as not appropriate support at home  Length of Stay: No - extended slightly due to SNF bed search with Medicaid pending pt - LOS=10 days  Discharge activity level: Yes - supervision w/c level  Home/community participation: No  Services provided included: MD, RD, PT, OT, RN, TR, Pharmacy and SW  Financial Services: Other: Medicaid and SSD apps pending  Follow-up services arranged: Other: SNF @ [facility]  Comments (or additional information):  Patient/Family verbalized understanding of follow-up arrangements: Yes  Individual responsible for coordination of the follow-up plan: patient  Confirmed correct DME delivered: NA    Fritzie Prioleau

## 2014-03-14 NOTE — Progress Notes (Signed)
Pt discharged to SNF. Discharge information provided to transporter. Prior to discharge, pt attempted to leave facility via elevator. Staff returned pt back to room. No evidence of harm noted. Education provided regarding safety. Pt verbalized understanding.

## 2014-03-14 NOTE — Progress Notes (Signed)
  Radiation Oncology         [phone] ________________________________  Name: Danny Hansen MRN: [id number]  Date: 03/03/2014  DOB: 03-14-1956  End of Treatment Note  Diagnosis:   Right hip heterotopic ossification     Indication for treatment:  Curative       Radiation treatment dates:   03/03/2014  Site/dose:   The patient was treated to the right hip and surrounding soft tissue high-risk regions to a dose of 6 gray in 1 fraction. This was completed using AP and PA fields.  Narrative: The patient tolerated radiation treatment relatively well.   He did not have any difficulties with treatment.  Plan: The patient has completed radiation treatment. The patient will followup in our clinic on a period basis.  ________________________________  Radene Gunning, M.D., Ph.D.

## 2014-03-18 ENCOUNTER — Encounter: Payer: Self-pay | Admitting: Internal Medicine

## 2014-03-18 ENCOUNTER — Other Ambulatory Visit: Payer: Self-pay | Admitting: Internal Medicine

## 2014-03-18 ENCOUNTER — Non-Acute Institutional Stay (SKILLED_NURSING_FACILITY): Payer: Self-pay | Admitting: Internal Medicine

## 2014-03-18 DIAGNOSIS — IMO0002 Reserved for concepts with insufficient information to code with codable children: Secondary | ICD-10-CM

## 2014-03-18 DIAGNOSIS — IMO0001 Reserved for inherently not codable concepts without codable children: Secondary | ICD-10-CM

## 2014-03-18 DIAGNOSIS — D62 Acute posthemorrhagic anemia: Secondary | ICD-10-CM

## 2014-03-18 DIAGNOSIS — K5900 Constipation, unspecified: Secondary | ICD-10-CM

## 2014-03-18 DIAGNOSIS — N35014 Post-traumatic urethral stricture, male, unspecified: Secondary | ICD-10-CM

## 2014-03-18 DIAGNOSIS — I82403 Acute embolism and thrombosis of unspecified deep veins of lower extremity, bilateral: Secondary | ICD-10-CM

## 2014-03-18 DIAGNOSIS — F1029 Alcohol dependence with unspecified alcohol-induced disorder: Secondary | ICD-10-CM

## 2014-03-18 DIAGNOSIS — B1920 Unspecified viral hepatitis C without hepatic coma: Secondary | ICD-10-CM

## 2014-03-18 DIAGNOSIS — F10988 Alcohol use, unspecified with other alcohol-induced disorder: Secondary | ICD-10-CM

## 2014-03-18 DIAGNOSIS — S32401D Unspecified fracture of right acetabulum, subsequent encounter for fracture with routine healing: Secondary | ICD-10-CM

## 2014-03-18 DIAGNOSIS — F1910 Other psychoactive substance abuse, uncomplicated: Secondary | ICD-10-CM

## 2014-03-18 DIAGNOSIS — S72402F Unspecified fracture of lower end of left femur, subsequent encounter for open fracture type IIIA, IIIB, or IIIC with routine healing: Secondary | ICD-10-CM

## 2014-03-18 DIAGNOSIS — I82409 Acute embolism and thrombosis of unspecified deep veins of unspecified lower extremity: Secondary | ICD-10-CM

## 2014-03-18 DIAGNOSIS — F1020 Alcohol dependence, uncomplicated: Secondary | ICD-10-CM

## 2014-03-18 DIAGNOSIS — S7290XD Unspecified fracture of unspecified femur, subsequent encounter for closed fracture with routine healing: Secondary | ICD-10-CM

## 2014-03-18 LAB — PROTIME-INR
INR: 1.93 — ABNORMAL HIGH (ref <1.50)
Prothrombin Time: 22.3 seconds — ABNORMAL HIGH (ref 11.6–15.2)
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
## 2014-03-18 NOTE — Progress Notes (Signed)
Occupational Therapy Discharge Summary  Patient Details  Name: Danny Hansen MRN: [id number] Date of Birth: 07/24/1955     Patient has met 10 of 10 long term goals due to improved activity tolerance, improved balance, ability to compensate for deficits and improved awareness.  Patient to discharge at overall Supervision level.  Patient is discharging to the SNF secondary to being limited with weightbearing in both LEs and not having adequate assist post discharge.   Reasons goals not met: NA  Recommendation:  Patient will benefit from ongoing skilled OT services in skilled nursing facility setting to continue to advance functional skills in the area of BADL. Recommend continued OT to help further progress independence with selfcare tasks to a modified independent level.    Equipment: No equipment provided  Reasons for discharge: treatment goals met and discharge from hospital  Patient/family agrees with progress made and goals achieved: Yes  OT Discharge Precautions/Restrictions  Precautions Precautions: Posterior Hip;Fall Precaution Comments: NWB LLE, TDWB RLE; Pt able to accurately recall 100% of precautions- throguhout session Required Braces or Orthoses: Other Brace/Splint Other Brace/Splint: bledsoe brace to LLE Restrictions Weight Bearing Restrictions: Yes RLE Weight Bearing: Touchdown weight bearing LLE Weight Bearing: Non weight bearing  ADL  See FIM scale for details  Vision/Perception  Vision- History Baseline Vision/History: Wears glasses Wears Glasses: Reading only Patient Visual Report: No change from baseline Vision- Assessment Vision Assessment?: Yes Eye Alignment: Within Functional Limits Ocular Range of Motion: Within Functional Limits Alignment/Gaze Preference: Within Defined Limits Tracking/Visual Pursuits: Able to track stimulus in all quads without difficulty Saccades: Within functional limits Convergence: Within functional limits Visual  Fields: No apparent deficits  Cognition Overall Cognitive Status: Within Functional Limits for tasks assessed Arousal/Alertness: Awake/alert Orientation Level: Oriented X4 Attention: Selective Selective Attention: Appears intact Memory: Appears intact Sensation Sensation Light Touch: Appears Intact Hot/Cold: Appears Intact Proprioception: Appears Intact Additional Comments: Sensation intact in bilateral UEs Coordination Gross Motor Movements are Fluid and Coordinated: Yes Fine Motor Movements are Fluid and Coordinated: Yes Motor  Motor Motor: Within Functional Limits Mobility  Bed Mobility Supine to Sit: 5: Supervision Supine to Sit Details: Verbal cues for precautions/safety;Verbal cues for technique;Verbal cues for sequencing Sit to Supine: 5: Supervision  Trunk/Postural Assessment  Cervical Assessment Cervical Assessment: Within Functional Limits Thoracic Assessment Thoracic Assessment: Within Functional Limits Lumbar Assessment Lumbar Assessment: Within Functional Limits Postural Control Postural Control: Within Functional Limits  Balance Balance Balance Assessed: Yes Static Sitting Balance Static Sitting - Balance Support: Feet unsupported;No upper extremity supported Static Sitting - Level of Assistance: 7: Independent Dynamic Sitting Balance Dynamic Sitting - Balance Support: Left upper extremity supported;Right upper extremity supported;Bilateral upper extremity supported Dynamic Sitting - Level of Assistance: 5: Stand by assistance Extremity/Trunk Assessment RUE Assessment RUE Assessment: Within Functional Limits LUE Assessment LUE Assessment: Within Functional Limits  See FIM for current functional status  MCGUIRE,JAMES OTR/L 03/18/2014, 4:42 PM  

## 2014-03-19 ENCOUNTER — Encounter: Payer: Self-pay | Admitting: Internal Medicine

## 2014-03-19 MED ORDER — SENNA 8.6 MG PO TABS: 2.0000 | ORAL_TABLET | Freq: Two times a day (BID) | ORAL | Status: DC

## 2014-03-20 ENCOUNTER — Encounter (HOSPITAL_COMMUNITY): Payer: Self-pay | Admitting: Orthopedic Surgery

## 2014-03-21 ENCOUNTER — Non-Acute Institutional Stay (SKILLED_NURSING_FACILITY): Payer: Self-pay | Admitting: Internal Medicine

## 2014-03-21 ENCOUNTER — Other Ambulatory Visit: Payer: Self-pay | Admitting: Internal Medicine

## 2014-03-21 DIAGNOSIS — I82409 Acute embolism and thrombosis of unspecified deep veins of unspecified lower extremity: Secondary | ICD-10-CM

## 2014-03-21 DIAGNOSIS — Z5181 Encounter for therapeutic drug level monitoring: Secondary | ICD-10-CM

## 2014-03-21 DIAGNOSIS — I82403 Acute embolism and thrombosis of unspecified deep veins of lower extremity, bilateral: Secondary | ICD-10-CM

## 2014-03-21 DIAGNOSIS — Z7901 Long term (current) use of anticoagulants: Secondary | ICD-10-CM

## 2014-03-21 LAB — PROTIME-INR
INR: 1.68 — ABNORMAL HIGH (ref <1.50)
Prothrombin Time: 20 seconds — ABNORMAL HIGH (ref 11.6–15.2)

## 2014-03-21 NOTE — Progress Notes (Signed)
Patient ID: Danny Hansen, male   DOB: December 18, 1955, 58 y.o.   MRN: [id number]    Facility: [facility]  Chief Complaint  Patient presents with  . Anticoagulation    HPI Patient seen today for anticoagulation visit.      On coumadin for dvt and recent fracture      .  Last INR 1.93 on 03/18/14 on 5 mg coumadin       INR today 1.68  Review of Systems:  Constitutional: Negative for fever, chills Respiratory: Negative for cough, sputum production, shortness of breath Cardiovascular: Negative for chest pain, palpitations Musculoskeletal: Negative for falls  Physical exam Vss, afebrile  Constitutional: He is oriented to person, place, and time. He appears well-developed and well-nourished. No distress.  Cardiovascular: Normal rate, regular rhythm, normal heart sounds and intact distal pulses.   Pulmonary/Chest: Effort normal and breath sounds normal. No respiratory distress.  Abdominal: Soft. Bowel sounds are normal. He exhibits no distension and no mass. There is no tenderness.  Neurological: He is alert and oriented to person, place, and time.   Assessment/plan  DVT inr today 1.68. Goal inr 2-3. Increase coumadin to 6 mg daily and check inr 03/26/14  subtherapeutic inr Increase coumadin to 6 mg daily ad recheck inr  Long term anticoagulation Goal inr 2-3. Continue coumadin, see above

## 2014-03-28 ENCOUNTER — Other Ambulatory Visit: Payer: Self-pay | Admitting: Internal Medicine

## 2014-03-28 LAB — PROTIME-INR
INR: 2.24 — ABNORMAL HIGH (ref <1.50)
Prothrombin Time: 25 seconds — ABNORMAL HIGH (ref 11.6–15.2)

## 2014-04-02 ENCOUNTER — Other Ambulatory Visit: Payer: Self-pay | Admitting: Internal Medicine

## 2014-04-02 LAB — PROTIME-INR
INR: 2.39 — ABNORMAL HIGH (ref <1.50)
Prothrombin Time: 26.3 seconds — ABNORMAL HIGH (ref 11.6–15.2)

## 2014-04-08 ENCOUNTER — Encounter: Payer: Self-pay | Admitting: Internal Medicine

## 2014-04-08 ENCOUNTER — Other Ambulatory Visit: Payer: Self-pay | Admitting: Internal Medicine

## 2014-04-08 ENCOUNTER — Non-Acute Institutional Stay (SKILLED_NURSING_FACILITY): Payer: Self-pay | Admitting: Internal Medicine

## 2014-04-08 DIAGNOSIS — B1920 Unspecified viral hepatitis C without hepatic coma: Secondary | ICD-10-CM

## 2014-04-08 DIAGNOSIS — S7290XD Unspecified fracture of unspecified femur, subsequent encounter for closed fracture with routine healing: Secondary | ICD-10-CM

## 2014-04-08 DIAGNOSIS — S32401D Unspecified fracture of right acetabulum, subsequent encounter for fracture with routine healing: Secondary | ICD-10-CM

## 2014-04-08 DIAGNOSIS — L02419 Cutaneous abscess of limb, unspecified: Secondary | ICD-10-CM

## 2014-04-08 DIAGNOSIS — S72402F Unspecified fracture of lower end of left femur, subsequent encounter for open fracture type IIIA, IIIB, or IIIC with routine healing: Secondary | ICD-10-CM

## 2014-04-08 DIAGNOSIS — IMO0001 Reserved for inherently not codable concepts without codable children: Secondary | ICD-10-CM

## 2014-04-08 DIAGNOSIS — D62 Acute posthemorrhagic anemia: Secondary | ICD-10-CM

## 2014-04-08 DIAGNOSIS — F1910 Other psychoactive substance abuse, uncomplicated: Secondary | ICD-10-CM

## 2014-04-08 DIAGNOSIS — L03119 Cellulitis of unspecified part of limb: Secondary | ICD-10-CM

## 2014-04-08 DIAGNOSIS — I82403 Acute embolism and thrombosis of unspecified deep veins of lower extremity, bilateral: Secondary | ICD-10-CM

## 2014-04-08 DIAGNOSIS — R197 Diarrhea, unspecified: Secondary | ICD-10-CM

## 2014-04-08 DIAGNOSIS — I82409 Acute embolism and thrombosis of unspecified deep veins of unspecified lower extremity: Secondary | ICD-10-CM

## 2014-04-08 DIAGNOSIS — L03116 Cellulitis of left lower limb: Secondary | ICD-10-CM
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
## 2014-04-09 LAB — URINALYSIS, ROUTINE W REFLEX MICROSCOPIC
Bilirubin Urine: NEGATIVE
Glucose, UA: NEGATIVE mg/dL
Hgb urine dipstick: NEGATIVE
Ketones, ur: NEGATIVE mg/dL
Leukocytes, UA: NEGATIVE
Nitrite: NEGATIVE
Protein, ur: NEGATIVE mg/dL
Specific Gravity, Urine: <1.005 — ABNORMAL LOW (ref 1.005–1.030)
Urobilinogen, UA: 0.2 mg/dL (ref 0.0–1.0)
pH: 6 (ref 5.0–8.0)

## 2014-04-10 LAB — DRUG SCREEN, URINE, NO CONFIRMATION
Amphetamine Screen, Ur: NEGATIVE
Barbiturate Quant, Ur: NEGATIVE
Benzodiazepines.: NEGATIVE
Cocaine Metabolites: NEGATIVE
Creatinine,U: 25.4 mg/dL
Marijuana Metabolite: NEGATIVE
Methadone: NEGATIVE
Opiate Screen, Urine: NEGATIVE
Phencyclidine (PCP): NEGATIVE
Propoxyphene: NEGATIVE

## 2014-04-10 LAB — URINE CULTURE: Colony Count: 3000

## 2014-04-11 ENCOUNTER — Other Ambulatory Visit: Payer: Self-pay | Admitting: Internal Medicine

## 2014-04-11 LAB — PROTIME-INR
INR: 3.17 — ABNORMAL HIGH (ref <1.50)
Prothrombin Time: 32.8 seconds — ABNORMAL HIGH (ref 11.6–15.2)

## 2014-04-12 ENCOUNTER — Other Ambulatory Visit: Payer: Self-pay | Admitting: Internal Medicine

## 2014-04-12 LAB — PROTIME-INR
INR: 3.2 — ABNORMAL HIGH (ref <1.50)
Prothrombin Time: 32.8 seconds — ABNORMAL HIGH (ref 11.6–15.2)

## 2014-04-13 ENCOUNTER — Other Ambulatory Visit: Payer: Self-pay | Admitting: Internal Medicine

## 2014-04-13 LAB — PROTIME-INR
INR: 3.11 — ABNORMAL HIGH (ref <1.50)
Prothrombin Time: 32 seconds — ABNORMAL HIGH (ref 11.6–15.2)

## 2014-04-14 ENCOUNTER — Other Ambulatory Visit: Payer: Self-pay | Admitting: Internal Medicine

## 2014-04-14 LAB — PROTIME-INR
INR: 3.26 — ABNORMAL HIGH (ref <1.50)
Prothrombin Time: 33.2 seconds — ABNORMAL HIGH (ref 11.6–15.2)

## 2014-04-18 ENCOUNTER — Ambulatory Visit: Payer: Self-pay | Admitting: Internal Medicine

## 2014-04-25 ENCOUNTER — Encounter (HOSPITAL_COMMUNITY): Payer: Self-pay | Admitting: Emergency Medicine

## 2014-04-25 DIAGNOSIS — Z8619 Personal history of other infectious and parasitic diseases: Secondary | ICD-10-CM | POA: Insufficient documentation

## 2014-04-25 DIAGNOSIS — Z87828 Personal history of other (healed) physical injury and trauma: Secondary | ICD-10-CM | POA: Insufficient documentation

## 2014-04-25 DIAGNOSIS — Z79899 Other long term (current) drug therapy: Secondary | ICD-10-CM | POA: Insufficient documentation

## 2014-04-25 DIAGNOSIS — Z87438 Personal history of other diseases of male genital organs: Secondary | ICD-10-CM | POA: Insufficient documentation

## 2014-04-25 DIAGNOSIS — R1084 Generalized abdominal pain: Secondary | ICD-10-CM | POA: Insufficient documentation

## 2014-04-25 DIAGNOSIS — R197 Diarrhea, unspecified: Secondary | ICD-10-CM | POA: Insufficient documentation

## 2014-04-25 DIAGNOSIS — Z720 Tobacco use: Secondary | ICD-10-CM | POA: Insufficient documentation

## 2014-04-25 DIAGNOSIS — R1110 Vomiting, unspecified: Secondary | ICD-10-CM | POA: Insufficient documentation

## 2014-04-25 DIAGNOSIS — I10 Essential (primary) hypertension: Secondary | ICD-10-CM | POA: Insufficient documentation

## 2014-04-25 NOTE — ED Notes (Signed)
Pt. reports nausea, vomitting and diarrhea onset last week , respirations unlabored / denies fever or chills.

## 2014-04-26 ENCOUNTER — Emergency Department (HOSPITAL_COMMUNITY): Payer: MEDICAID

## 2014-04-26 ENCOUNTER — Encounter (HOSPITAL_COMMUNITY): Payer: Self-pay | Admitting: Radiology

## 2014-04-26 ENCOUNTER — Emergency Department (HOSPITAL_COMMUNITY)
Admission: EM | Admit: 2014-04-26 | Discharge: 2014-04-26 | Disposition: A | Discharge status: Home / Self Care | Payer: Self-pay | Attending: Emergency Medicine | Admitting: Emergency Medicine

## 2014-04-26 DIAGNOSIS — R197 Diarrhea, unspecified: Secondary | ICD-10-CM

## 2014-04-26 DIAGNOSIS — R1110 Vomiting, unspecified: Secondary | ICD-10-CM

## 2014-04-26 DIAGNOSIS — R1084 Generalized abdominal pain: Secondary | ICD-10-CM

## 2014-04-26 LAB — CBC WITH DIFFERENTIAL/PLATELET
BASOS ABS: 0 10*3/uL (ref 0.0–0.1)
Basophils Relative: 0 % (ref 0–1)
EOS ABS: 0 10*3/uL (ref 0.0–0.7)
Eosinophils Relative: 0 % (ref 0–5)
HEMATOCRIT: 38.8 % — AB (ref 39.0–52.0)
Hemoglobin: 13.8 g/dL (ref 13.0–17.0)
LYMPHS PCT: 23 % (ref 12–46)
Lymphs Abs: 1.2 10*3/uL (ref 0.7–4.0)
MCH: 32.5 pg (ref 26.0–34.0)
MCHC: 35.6 g/dL (ref 30.0–36.0)
MCV: 91.3 fL (ref 78.0–100.0)
MONO ABS: 0.4 10*3/uL (ref 0.1–1.0)
Monocytes Relative: 8 % (ref 3–12)
Neutro Abs: 3.4 10*3/uL (ref 1.7–7.7)
Neutrophils Relative %: 69 % (ref 43–77)
Platelets: 188 10*3/uL (ref 150–400)
RBC: 4.25 MIL/uL (ref 4.22–5.81)
RDW: 12.7 % (ref 11.5–15.5)
WBC: 5 10*3/uL (ref 4.0–10.5)

## 2014-04-26 LAB — COMPREHENSIVE METABOLIC PANEL
ALT: 44 U/L (ref 0–53)
AST: 100 U/L — ABNORMAL HIGH (ref 0–37)
Albumin: 3.6 g/dL (ref 3.5–5.2)
Alkaline Phosphatase: 176 U/L — ABNORMAL HIGH (ref 39–117)
Anion gap: 16 — ABNORMAL HIGH (ref 5–15)
BUN: 3 mg/dL — ABNORMAL LOW (ref 6–23)
CO2: 26 meq/L (ref 19–32)
CREATININE: 0.43 mg/dL — AB (ref 0.50–1.35)
Calcium: 9.9 mg/dL (ref 8.4–10.5)
Chloride: 87 mEq/L — ABNORMAL LOW (ref 96–112)
GLUCOSE: 132 mg/dL — AB (ref 70–99)
Potassium: 3.9 mEq/L (ref 3.7–5.3)
Sodium: 129 mEq/L — ABNORMAL LOW (ref 137–147)
TOTAL PROTEIN: 9.4 g/dL — AB (ref 6.0–8.3)
Total Bilirubin: 1 mg/dL (ref 0.3–1.2)

## 2014-04-26 LAB — URINALYSIS, ROUTINE W REFLEX MICROSCOPIC
BILIRUBIN URINE: NEGATIVE
Glucose, UA: NEGATIVE mg/dL
HGB URINE DIPSTICK: NEGATIVE
Ketones, ur: NEGATIVE mg/dL
Leukocytes, UA: NEGATIVE
NITRITE: NEGATIVE
PROTEIN: NEGATIVE mg/dL
Specific Gravity, Urine: <1.005 — ABNORMAL LOW (ref 1.005–1.030)
UROBILINOGEN UA: 0.2 mg/dL (ref 0.0–1.0)
pH: 7 (ref 5.0–8.0)

## 2014-04-26 LAB — PROTIME-INR
INR: 1.55 — AB (ref 0.00–1.49)
PROTHROMBIN TIME: 18.7 s — AB (ref 11.6–15.2)

## 2014-04-26 LAB — LIPASE, BLOOD: LIPASE: 16 U/L (ref 11–59)

## 2014-04-26 MED ORDER — ONDANSETRON HCL 4 MG/2ML IJ SOLN
4.0000 mg | Freq: Once | INTRAMUSCULAR | Status: AC
  Administered 2014-04-26: 4 mg via INTRAVENOUS
  Filled 2014-04-26: qty 2

## 2014-04-26 MED ORDER — ONDANSETRON 4 MG PO TBDP: ORAL_TABLET | ORAL | Status: DC

## 2014-04-26 MED ORDER — SODIUM CHLORIDE 0.9 % IV BOLUS (SEPSIS)
1000.0000 mL | Freq: Once | INTRAVENOUS | Status: AC
  Administered 2014-04-26: 1000 mL via INTRAVENOUS

## 2014-04-26 MED ORDER — DICYCLOMINE HCL 20 MG PO TABS: 20.0000 mg | ORAL_TABLET | Freq: Two times a day (BID) | ORAL | Status: DC | PRN

## 2014-04-26 MED ORDER — MORPHINE SULFATE 4 MG/ML IJ SOLN
4.0000 mg | Freq: Once | INTRAMUSCULAR | Status: AC
  Administered 2014-04-26: 4 mg via INTRAVENOUS
  Filled 2014-04-26: qty 1

## 2014-04-26 MED ORDER — IOHEXOL 300 MG/ML  SOLN
100.0000 mL | Freq: Once | INTRAMUSCULAR | Status: AC | PRN
  Administered 2014-04-26: 100 mL via INTRAVENOUS

## 2014-04-26 MED ORDER — IOHEXOL 300 MG/ML  SOLN: 25.0000 mL | Freq: Once | INTRAMUSCULAR | Status: DC | PRN

## 2014-04-26 NOTE — Discharge Instructions (Signed)
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
## 2014-05-06 ENCOUNTER — Encounter: Payer: Self-pay | Admitting: Internal Medicine

## 2014-05-06 ENCOUNTER — Ambulatory Visit: Payer: Self-pay | Attending: Internal Medicine | Admitting: Internal Medicine

## 2014-05-06 DIAGNOSIS — Z86718 Personal history of other venous thrombosis and embolism: Secondary | ICD-10-CM | POA: Insufficient documentation

## 2014-05-06 DIAGNOSIS — N400 Enlarged prostate without lower urinary tract symptoms: Secondary | ICD-10-CM | POA: Insufficient documentation

## 2014-05-06 DIAGNOSIS — I10 Essential (primary) hypertension: Secondary | ICD-10-CM | POA: Insufficient documentation

## 2014-05-06 DIAGNOSIS — Z23 Encounter for immunization: Secondary | ICD-10-CM | POA: Insufficient documentation

## 2014-05-06 DIAGNOSIS — R21 Rash and other nonspecific skin eruption: Secondary | ICD-10-CM | POA: Insufficient documentation

## 2014-05-06 DIAGNOSIS — F17200 Nicotine dependence, unspecified, uncomplicated: Secondary | ICD-10-CM | POA: Insufficient documentation

## 2014-05-06 DIAGNOSIS — Z720 Tobacco use: Secondary | ICD-10-CM

## 2014-05-06 DIAGNOSIS — B1920 Unspecified viral hepatitis C without hepatic coma: Secondary | ICD-10-CM | POA: Insufficient documentation

## 2014-05-06 DIAGNOSIS — I82403 Acute embolism and thrombosis of unspecified deep veins of lower extremity, bilateral: Secondary | ICD-10-CM

## 2014-05-06 DIAGNOSIS — R7989 Other specified abnormal findings of blood chemistry: Secondary | ICD-10-CM | POA: Insufficient documentation

## 2014-05-06 DIAGNOSIS — R945 Abnormal results of liver function studies: Secondary | ICD-10-CM

## 2014-05-06 DIAGNOSIS — F1021 Alcohol dependence, in remission: Secondary | ICD-10-CM | POA: Insufficient documentation

## 2014-05-06 LAB — POCT INR

## 2014-05-06 MED ORDER — METHYLPREDNISOLONE 4 MG PO KIT: PACK | ORAL | Status: DC

## 2014-05-06 MED ORDER — CLOTRIMAZOLE-BETAMETHASONE 1-0.05 % EX CREA: 1.0000 "application " | TOPICAL_CREAM | Freq: Two times a day (BID) | CUTANEOUS | Status: DC

## 2014-05-06 MED ORDER — WARFARIN SODIUM 5 MG PO TABS: 5.0000 mg | ORAL_TABLET | Freq: Every day | ORAL | Status: DC
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
## 2014-05-09 ENCOUNTER — Ambulatory Visit: Payer: Self-pay | Attending: Family Medicine | Admitting: *Deleted

## 2014-05-09 VITALS — BP 130/89 | HR 76

## 2014-05-09 DIAGNOSIS — I82403 Acute embolism and thrombosis of unspecified deep veins of lower extremity, bilateral: Secondary | ICD-10-CM

## 2014-05-09 DIAGNOSIS — R21 Rash and other nonspecific skin eruption: Secondary | ICD-10-CM

## 2014-05-09 MED ORDER — METHYLPREDNISOLONE 4 MG PO KIT: PACK | ORAL | Status: DC

## 2014-05-09 MED ORDER — WARFARIN SODIUM 5 MG PO TABS: 5.0000 mg | ORAL_TABLET | Freq: Every day | ORAL | Status: DC

## 2014-05-09 MED ORDER — ONDANSETRON 4 MG PO TBDP: ORAL_TABLET | ORAL | Status: DC

## 2014-05-09 MED ORDER — TRAMADOL HCL 50 MG PO TABS: 50.0000 mg | ORAL_TABLET | Freq: Three times a day (TID) | ORAL | Status: DC | PRN

## 2014-05-09 MED ORDER — DICYCLOMINE HCL 20 MG PO TABS: 20.0000 mg | ORAL_TABLET | Freq: Two times a day (BID) | ORAL | Status: DC | PRN

## 2014-05-09 MED ORDER — FOLIC ACID 1 MG PO TABS: 1.0000 mg | ORAL_TABLET | Freq: Every day | ORAL | Status: DC

## 2014-05-09 NOTE — Progress Notes (Signed)
Patient ID: Danny Hansen, male   DOB: [birth year], 58 y.o.   MRN: [id number]  Patient arrived with wife for INR check However, patient has not taken warfarin in 3 weeks States he has not filled any of his meds except folic acid and oxycodone  [facility] with the exception of oxycodone. Instead  Tramadol 50 mg q8h prn # 60 with [facility] ref[facility]  Patient instructed to start warfarin today and return in 3 days (05/12/14) for INR check

## 2014-05-09 NOTE — Progress Notes (Signed)
Nurse visit BP check and INR Pt stated not taking medication for a couple weeks  BP 130/89 P 76

## 2014-05-12 ENCOUNTER — Ambulatory Visit: Payer: Self-pay | Attending: Internal Medicine

## 2014-05-12 DIAGNOSIS — I824Y9 Acute embolism and thrombosis of unspecified deep veins of unspecified proximal lower extremity: Secondary | ICD-10-CM | POA: Insufficient documentation

## 2014-05-12 DIAGNOSIS — I82403 Acute embolism and thrombosis of unspecified deep veins of lower extremity, bilateral: Secondary | ICD-10-CM

## 2014-05-12 DIAGNOSIS — B1920 Unspecified viral hepatitis C without hepatic coma: Secondary | ICD-10-CM

## 2014-05-12 DIAGNOSIS — D62 Acute posthemorrhagic anemia: Secondary | ICD-10-CM

## 2014-05-12 DIAGNOSIS — I824Y3 Acute embolism and thrombosis of unspecified deep veins of proximal lower extremity, bilateral: Secondary | ICD-10-CM

## 2014-05-12 DIAGNOSIS — F1029 Alcohol dependence with unspecified alcohol-induced disorder: Secondary | ICD-10-CM

## 2014-05-12 LAB — POCT INR
INR: 1.6
INR: 1.6

## 2014-05-12 NOTE — Patient Instructions (Addendum)
Take 1 tablet half tonight then 1 tablet everyday Tuesday-Sunday Return to clinic Monday for repeat INR

## 2014-05-20 ENCOUNTER — Other Ambulatory Visit: Payer: Self-pay

## 2014-05-20 ENCOUNTER — Ambulatory Visit: Payer: Self-pay | Attending: Internal Medicine

## 2014-05-20 DIAGNOSIS — B182 Chronic viral hepatitis C: Secondary | ICD-10-CM

## 2014-05-20 DIAGNOSIS — B1920 Unspecified viral hepatitis C without hepatic coma: Secondary | ICD-10-CM

## 2014-05-20 DIAGNOSIS — I82403 Acute embolism and thrombosis of unspecified deep veins of lower extremity, bilateral: Secondary | ICD-10-CM

## 2014-05-20 DIAGNOSIS — R791 Abnormal coagulation profile: Secondary | ICD-10-CM

## 2014-05-20 DIAGNOSIS — I824Y3 Acute embolism and thrombosis of unspecified deep veins of proximal lower extremity, bilateral: Secondary | ICD-10-CM

## 2014-05-20 DIAGNOSIS — D62 Acute posthemorrhagic anemia: Secondary | ICD-10-CM

## 2014-05-20 DIAGNOSIS — F1029 Alcohol dependence with unspecified alcohol-induced disorder: Secondary | ICD-10-CM

## 2014-05-20 LAB — HEPATITIS B SURFACE ANTIBODY,QUALITATIVE: HEP B S AB: NEGATIVE

## 2014-05-20 LAB — HEPATITIS B SURFACE ANTIGEN: Hepatitis B Surface Ag: NEGATIVE

## 2014-05-20 LAB — POCT INR: INR: >8

## 2014-05-20 LAB — HEPATITIS B CORE ANTIBODY, TOTAL: Hep B Core Total Ab: REACTIVE — AB

## 2014-05-20 LAB — HIV ANTIBODY (ROUTINE TESTING W REFLEX): HIV: NONREACTIVE

## 2014-05-20 LAB — HEPATITIS A ANTIBODY, TOTAL: Hep A Total Ab: NONREACTIVE

## 2014-05-20 MED ORDER — PHYTONADIONE 5 MG PO TABS
5.0000 mg | ORAL_TABLET | Freq: Once | ORAL | Status: AC
  Administered 2014-05-20: 5 mg via ORAL

## 2014-05-21 LAB — IRON: Iron: 111 ug/dL (ref 42–165)

## 2014-05-21 LAB — ANA: Anti Nuclear Antibody(ANA): NEGATIVE

## 2014-05-21 LAB — HEPATITIS C RNA QUANTITATIVE
HCV Quantitative Log: 6.21 {Log} — ABNORMAL HIGH (ref <1.18)
HCV Quantitative: 1622926 IU/mL — ABNORMAL HIGH (ref <15)

## 2014-05-22 ENCOUNTER — Ambulatory Visit: Payer: Self-pay | Attending: Internal Medicine

## 2014-05-22 DIAGNOSIS — I82403 Acute embolism and thrombosis of unspecified deep veins of lower extremity, bilateral: Secondary | ICD-10-CM

## 2014-05-22 DIAGNOSIS — I824Y3 Acute embolism and thrombosis of unspecified deep veins of proximal lower extremity, bilateral: Secondary | ICD-10-CM

## 2014-05-22 DIAGNOSIS — F1029 Alcohol dependence with unspecified alcohol-induced disorder: Secondary | ICD-10-CM

## 2014-05-22 DIAGNOSIS — B1920 Unspecified viral hepatitis C without hepatic coma: Secondary | ICD-10-CM

## 2014-05-22 DIAGNOSIS — D62 Acute posthemorrhagic anemia: Secondary | ICD-10-CM

## 2014-05-22 LAB — POCT INR: INR: 4.2

## 2014-05-23 LAB — HEPATITIS C GENOTYPE

## 2014-05-26 ENCOUNTER — Ambulatory Visit: Payer: Self-pay | Attending: Internal Medicine

## 2014-05-26 DIAGNOSIS — I824Y3 Acute embolism and thrombosis of unspecified deep veins of proximal lower extremity, bilateral: Secondary | ICD-10-CM

## 2014-05-26 DIAGNOSIS — I82403 Acute embolism and thrombosis of unspecified deep veins of lower extremity, bilateral: Secondary | ICD-10-CM

## 2014-05-26 DIAGNOSIS — D62 Acute posthemorrhagic anemia: Secondary | ICD-10-CM

## 2014-05-26 DIAGNOSIS — F1029 Alcohol dependence with unspecified alcohol-induced disorder: Secondary | ICD-10-CM

## 2014-05-26 DIAGNOSIS — B1920 Unspecified viral hepatitis C without hepatic coma: Secondary | ICD-10-CM

## 2014-05-28 ENCOUNTER — Encounter (HOSPITAL_COMMUNITY): Payer: Self-pay | Admitting: *Deleted

## 2014-05-28 NOTE — Progress Notes (Signed)
Pt denies cardiac history. Denies chest pain. Does state that he has SOB with exertion at times. Pt states he's not had any alcohol for 2 months and no cocaine for 3 months. Still smokes, but is cutting down.  Pt stopped his Coumadin 05/19/14 for procedure.

## 2014-05-28 NOTE — Progress Notes (Signed)
Pt made aware of new arrival time of 7:45 AM

## 2014-05-28 NOTE — Progress Notes (Signed)
No pre-op orders in EPIC. Called Dr. Magdalene PatriciaHandy's office for orders [facility].
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
## 2014-05-29 ENCOUNTER — Encounter (HOSPITAL_COMMUNITY)
Admission: RE | Disposition: A | Discharge status: Home / Self Care | Payer: Self-pay | Source: Ambulatory Visit | Attending: Orthopedic Surgery

## 2014-05-29 ENCOUNTER — Encounter (HOSPITAL_COMMUNITY): Payer: Self-pay | Admitting: *Deleted

## 2014-05-29 ENCOUNTER — Inpatient Hospital Stay (HOSPITAL_COMMUNITY): Payer: No Typology Code available for payment source

## 2014-05-29 ENCOUNTER — Inpatient Hospital Stay (HOSPITAL_COMMUNITY): Payer: No Typology Code available for payment source | Admitting: Emergency Medicine

## 2014-05-29 ENCOUNTER — Observation Stay (HOSPITAL_COMMUNITY)
Admission: RE | Admit: 2014-05-29 | Discharge: 2014-05-30 | Disposition: A | Discharge status: Home / Self Care | Payer: No Typology Code available for payment source | Source: Ambulatory Visit | Attending: Orthopedic Surgery | Admitting: Orthopedic Surgery

## 2014-05-29 ENCOUNTER — Observation Stay (HOSPITAL_COMMUNITY): Payer: No Typology Code available for payment source

## 2014-05-29 DIAGNOSIS — Z79899 Other long term (current) drug therapy: Secondary | ICD-10-CM | POA: Diagnosis not present

## 2014-05-29 DIAGNOSIS — M549 Dorsalgia, unspecified: Secondary | ICD-10-CM | POA: Insufficient documentation

## 2014-05-29 DIAGNOSIS — Z86718 Personal history of other venous thrombosis and embolism: Secondary | ICD-10-CM | POA: Diagnosis not present

## 2014-05-29 DIAGNOSIS — T84041D Periprosthetic fracture around internal prosthetic left hip joint, subsequent encounter: Secondary | ICD-10-CM | POA: Insufficient documentation

## 2014-05-29 DIAGNOSIS — Z7901 Long term (current) use of anticoagulants: Secondary | ICD-10-CM | POA: Diagnosis not present

## 2014-05-29 DIAGNOSIS — B1920 Unspecified viral hepatitis C without hepatic coma: Secondary | ICD-10-CM | POA: Insufficient documentation

## 2014-05-29 DIAGNOSIS — K219 Gastro-esophageal reflux disease without esophagitis: Secondary | ICD-10-CM | POA: Diagnosis not present

## 2014-05-29 DIAGNOSIS — D62 Acute posthemorrhagic anemia: Secondary | ICD-10-CM | POA: Diagnosis not present

## 2014-05-29 DIAGNOSIS — S72402K Unspecified fracture of lower end of left femur, subsequent encounter for closed fracture with nonunion: Secondary | ICD-10-CM | POA: Diagnosis present

## 2014-05-29 DIAGNOSIS — M109 Gout, unspecified: Secondary | ICD-10-CM | POA: Diagnosis not present

## 2014-05-29 DIAGNOSIS — I10 Essential (primary) hypertension: Secondary | ICD-10-CM | POA: Diagnosis not present

## 2014-05-29 DIAGNOSIS — S72452M Displaced supracondylar fracture without intracondylar extension of lower end of left femur, subsequent encounter for open fracture type I or II with nonunion: Principal | ICD-10-CM | POA: Insufficient documentation

## 2014-05-29 DIAGNOSIS — N400 Enlarged prostate without lower urinary tract symptoms: Secondary | ICD-10-CM | POA: Diagnosis not present

## 2014-05-29 DIAGNOSIS — F17200 Nicotine dependence, unspecified, uncomplicated: Secondary | ICD-10-CM | POA: Diagnosis present

## 2014-05-29 DIAGNOSIS — I82409 Acute embolism and thrombosis of unspecified deep veins of unspecified lower extremity: Secondary | ICD-10-CM | POA: Diagnosis present

## 2014-05-29 DIAGNOSIS — S72402M Unspecified fracture of lower end of left femur, subsequent encounter for open fracture type I or II with nonunion: Secondary | ICD-10-CM | POA: Diagnosis present

## 2014-05-29 DIAGNOSIS — Z419 Encounter for procedure for purposes other than remedying health state, unspecified: Secondary | ICD-10-CM

## 2014-05-29 DIAGNOSIS — Z969 Presence of functional implant, unspecified: Secondary | ICD-10-CM

## 2014-05-29 DIAGNOSIS — S7292XM Unspecified fracture of left femur, subsequent encounter for open fracture type I or II with nonunion: Secondary | ICD-10-CM

## 2014-05-29 DIAGNOSIS — B182 Chronic viral hepatitis C: Secondary | ICD-10-CM | POA: Diagnosis present

## 2014-05-29 DIAGNOSIS — S7292XB Unspecified fracture of left femur, initial encounter for open fracture type I or II: Secondary | ICD-10-CM | POA: Diagnosis present

## 2014-05-29 DIAGNOSIS — Z01818 Encounter for other preprocedural examination: Secondary | ICD-10-CM

## 2014-05-29 HISTORY — DX: Headache: R51

## 2014-05-29 HISTORY — DX: Acute embolism and thrombosis of unspecified deep veins of unspecified lower extremity: I82.409

## 2014-05-29 HISTORY — DX: Alcohol abuse, in remission: F10.11

## 2014-05-29 HISTORY — DX: Dorsalgia, unspecified: M54.9

## 2014-05-29 HISTORY — PX: ALLOGRAFT APPLICATION: SHX6404

## 2014-05-29 HISTORY — PX: ORIF FEMUR FRACTURE: SHX2119

## 2014-05-29 HISTORY — DX: Gastro-esophageal reflux disease without esophagitis: K21.9

## 2014-05-29 HISTORY — DX: Unspecified osteoarthritis, unspecified site: M19.90

## 2014-05-29 HISTORY — DX: Reserved for inherently not codable concepts without codable children: IMO0001

## 2014-05-29 HISTORY — DX: Headache, unspecified: R51.9

## 2014-05-29 HISTORY — DX: Pneumonia, unspecified organism: J18.9

## 2014-05-29 LAB — COMPREHENSIVE METABOLIC PANEL
ALBUMIN: 3.4 g/dL — AB (ref 3.5–5.2)
ALK PHOS: 194 U/L — AB (ref 39–117)
ALT: 63 U/L — ABNORMAL HIGH (ref 0–53)
ANION GAP: 13 (ref 5–15)
AST: 69 U/L — ABNORMAL HIGH (ref 0–37)
BUN: 9 mg/dL (ref 6–23)
CALCIUM: 9.7 mg/dL (ref 8.4–10.5)
CO2: 25 mEq/L (ref 19–32)
CREATININE: 0.56 mg/dL (ref 0.50–1.35)
Chloride: 99 mEq/L (ref 96–112)
GFR calc non Af Amer: >90 mL/min (ref >90)
GLUCOSE: 90 mg/dL (ref 70–99)
Potassium: 4.4 mEq/L (ref 3.7–5.3)
Sodium: 137 mEq/L (ref 137–147)
Total Bilirubin: 0.6 mg/dL (ref 0.3–1.2)
Total Protein: 8.8 g/dL — ABNORMAL HIGH (ref 6.0–8.3)

## 2014-05-29 LAB — URINALYSIS, ROUTINE W REFLEX MICROSCOPIC
Bilirubin Urine: NEGATIVE
Glucose, UA: NEGATIVE mg/dL
HGB URINE DIPSTICK: NEGATIVE
Ketones, ur: NEGATIVE mg/dL
LEUKOCYTES UA: NEGATIVE
Nitrite: NEGATIVE
PH: 5.5 (ref 5.0–8.0)
PROTEIN: NEGATIVE mg/dL
Specific Gravity, Urine: 1.012 (ref 1.005–1.030)
Urobilinogen, UA: 1 mg/dL (ref 0.0–1.0)

## 2014-05-29 LAB — CBC
HEMATOCRIT: 40.8 % (ref 39.0–52.0)
HEMOGLOBIN: 14 g/dL (ref 13.0–17.0)
MCH: 31.5 pg (ref 26.0–34.0)
MCHC: 34.3 g/dL (ref 30.0–36.0)
MCV: 91.7 fL (ref 78.0–100.0)
Platelets: 224 10*3/uL (ref 150–400)
RBC: 4.45 MIL/uL (ref 4.22–5.81)
RDW: 13.3 % (ref 11.5–15.5)
WBC: 5.9 10*3/uL (ref 4.0–10.5)

## 2014-05-29 LAB — APTT: APTT: 32 s (ref 24–37)

## 2014-05-29 LAB — RAPID URINE DRUG SCREEN, HOSP PERFORMED
AMPHETAMINES: NOT DETECTED
BARBITURATES: NOT DETECTED
BENZODIAZEPINES: NOT DETECTED
COCAINE: NOT DETECTED
OPIATES: NOT DETECTED
TETRAHYDROCANNABINOL: NOT DETECTED

## 2014-05-29 LAB — ETHANOL: Alcohol, Ethyl (B): <11 mg/dL (ref 0–11)

## 2014-05-29 LAB — PROTIME-INR
INR: 1.16 (ref 0.00–1.49)
PROTHROMBIN TIME: 14.9 s (ref 11.6–15.2)

## 2014-05-29 SURGERY — OPEN REDUCTION INTERNAL FIXATION (ORIF) DISTAL FEMUR FRACTURE
Anesthesia: General | Site: Leg Lower | Laterality: Left

## 2014-05-29 MED ORDER — LACTATED RINGERS IV SOLN: INTRAVENOUS | Status: DC

## 2014-05-29 MED ORDER — LACTATED RINGERS IV SOLN
INTRAVENOUS | Status: DC
  Administered 2014-05-29: 10:00:00 via INTRAVENOUS

## 2014-05-29 MED ORDER — PROPOFOL 10 MG/ML IV BOLUS
INTRAVENOUS | Status: AC
  Filled 2014-05-29: qty 20

## 2014-05-29 MED ORDER — WARFARIN SODIUM 7.5 MG PO TABS
7.5000 mg | ORAL_TABLET | Freq: Once | ORAL | Status: AC
  Administered 2014-05-29: 7.5 mg via ORAL
  Filled 2014-05-29: qty 1

## 2014-05-29 MED ORDER — HYDROMORPHONE HCL 1 MG/ML IJ SOLN
INTRAMUSCULAR | Status: AC
  Filled 2014-05-29: qty 1

## 2014-05-29 MED ORDER — PROPOFOL 10 MG/ML IV BOLUS
INTRAVENOUS | Status: DC | PRN
  Administered 2014-05-29: 50 mg via INTRAVENOUS
  Administered 2014-05-29: 150 mg via INTRAVENOUS

## 2014-05-29 MED ORDER — LIDOCAINE HCL (CARDIAC) 20 MG/ML IV SOLN
INTRAVENOUS | Status: DC | PRN
  Administered 2014-05-29: 100 mg via INTRAVENOUS

## 2014-05-29 MED ORDER — ONDANSETRON HCL 4 MG PO TABS: 4.0000 mg | ORAL_TABLET | Freq: Four times a day (QID) | ORAL | Status: DC | PRN

## 2014-05-29 MED ORDER — SUCCINYLCHOLINE CHLORIDE 20 MG/ML IJ SOLN
INTRAMUSCULAR | Status: DC | PRN
  Administered 2014-05-29: 100 mg via INTRAVENOUS

## 2014-05-29 MED ORDER — FENTANYL CITRATE 0.05 MG/ML IJ SOLN
INTRAMUSCULAR | Status: AC
  Filled 2014-05-29: qty 2

## 2014-05-29 MED ORDER — FENTANYL CITRATE 0.05 MG/ML IJ SOLN
INTRAMUSCULAR | Status: AC
  Filled 2014-05-29: qty 5

## 2014-05-29 MED ORDER — METOCLOPRAMIDE HCL 5 MG PO TABS: 5.0000 mg | ORAL_TABLET | Freq: Three times a day (TID) | ORAL | Status: DC | PRN

## 2014-05-29 MED ORDER — MIDAZOLAM HCL 5 MG/5ML IJ SOLN
INTRAMUSCULAR | Status: DC | PRN
  Administered 2014-05-29: 2 mg via INTRAVENOUS

## 2014-05-29 MED ORDER — ACETAMINOPHEN 500 MG PO TABS: 1000.0000 mg | ORAL_TABLET | Freq: Once | ORAL | Status: DC

## 2014-05-29 MED ORDER — WARFARIN - PHARMACIST DOSING INPATIENT: Freq: Every day | Status: DC

## 2014-05-29 MED ORDER — ONDANSETRON HCL 4 MG/2ML IJ SOLN: 4.0000 mg | Freq: Four times a day (QID) | INTRAMUSCULAR | Status: DC | PRN

## 2014-05-29 MED ORDER — TRAMADOL HCL 50 MG PO TABS: 50.0000 mg | ORAL_TABLET | Freq: Three times a day (TID) | ORAL | Status: DC | PRN

## 2014-05-29 MED ORDER — FENTANYL CITRATE 0.05 MG/ML IJ SOLN
INTRAMUSCULAR | Status: DC | PRN
  Administered 2014-05-29 (×5): 50 ug via INTRAVENOUS

## 2014-05-29 MED ORDER — CEFAZOLIN SODIUM-DEXTROSE 2-3 GM-% IV SOLR
2.0000 g | Freq: Four times a day (QID) | INTRAVENOUS | Status: AC
  Administered 2014-05-29 – 2014-05-30 (×3): 2 g via INTRAVENOUS
  Filled 2014-05-29 (×4): qty 50

## 2014-05-29 MED ORDER — CEFAZOLIN SODIUM-DEXTROSE 2-3 GM-% IV SOLR
2.0000 g | INTRAVENOUS | Status: AC
  Administered 2014-05-29: 2 g via INTRAVENOUS
  Filled 2014-05-29: qty 50

## 2014-05-29 MED ORDER — HYDROCODONE-ACETAMINOPHEN 7.5-325 MG PO TABS
1.0000 | ORAL_TABLET | ORAL | Status: DC | PRN
Start: 2014-05-29 — End: 2014-05-30
  Administered 2014-05-30 (×2): 2 via ORAL
  Filled 2014-05-29 (×2): qty 2

## 2014-05-29 MED ORDER — METOCLOPRAMIDE HCL 5 MG/ML IJ SOLN: 5.0000 mg | Freq: Three times a day (TID) | INTRAMUSCULAR | Status: DC | PRN

## 2014-05-29 MED ORDER — PROMETHAZINE HCL 25 MG/ML IJ SOLN: 6.2500 mg | INTRAMUSCULAR | Status: DC | PRN

## 2014-05-29 MED ORDER — DICYCLOMINE HCL 20 MG PO TABS
20.0000 mg | ORAL_TABLET | Freq: Two times a day (BID) | ORAL | Status: DC | PRN
  Filled 2014-05-29: qty 1

## 2014-05-29 MED ORDER — ONDANSETRON HCL 4 MG/2ML IJ SOLN
INTRAMUSCULAR | Status: DC | PRN
Start: 2014-05-29 — End: 2014-05-29
  Administered 2014-05-29: 4 mg via INTRAVENOUS

## 2014-05-29 MED ORDER — HYDROMORPHONE HCL 1 MG/ML IJ SOLN
0.5000 mg | INTRAMUSCULAR | Status: DC | PRN
  Administered 2014-05-29 (×2): 1 mg via INTRAVENOUS
  Filled 2014-05-29: qty 1

## 2014-05-29 MED ORDER — DIPHENHYDRAMINE HCL 12.5 MG/5ML PO ELIX: 12.5000 mg | ORAL_SOLUTION | ORAL | Status: DC | PRN

## 2014-05-29 MED ORDER — EPHEDRINE SULFATE 50 MG/ML IJ SOLN
INTRAMUSCULAR | Status: DC | PRN
  Administered 2014-05-29: 10 mg via INTRAVENOUS
  Administered 2014-05-29 (×2): 5 mg via INTRAVENOUS

## 2014-05-29 MED ORDER — CHLORHEXIDINE GLUCONATE 4 % EX LIQD
60.0000 mL | Freq: Once | CUTANEOUS | Status: DC
  Filled 2014-05-29: qty 60

## 2014-05-29 MED ORDER — ARTIFICIAL TEARS OP OINT
TOPICAL_OINTMENT | OPHTHALMIC | Status: DC | PRN
  Administered 2014-05-29: 1 via OPHTHALMIC

## 2014-05-29 MED ORDER — MIDAZOLAM HCL 2 MG/2ML IJ SOLN
INTRAMUSCULAR | Status: AC
  Filled 2014-05-29: qty 2

## 2014-05-29 MED ORDER — DEXAMETHASONE SODIUM PHOSPHATE 4 MG/ML IJ SOLN
INTRAMUSCULAR | Status: DC | PRN
  Administered 2014-05-29: 4 mg via INTRAVENOUS

## 2014-05-29 MED ORDER — POTASSIUM CHLORIDE IN NACL 20-0.9 MEQ/L-% IV SOLN
INTRAVENOUS | Status: DC
  Administered 2014-05-29: 20:00:00 via INTRAVENOUS
  Filled 2014-05-29 (×2): qty 1000

## 2014-05-29 MED ORDER — WARFARIN SODIUM 5 MG PO TABS: 5.0000 mg | ORAL_TABLET | Freq: Every day | ORAL | Status: DC

## 2014-05-29 MED ORDER — MEPERIDINE HCL 25 MG/ML IJ SOLN: 6.2500 mg | INTRAMUSCULAR | Status: DC | PRN

## 2014-05-29 MED ORDER — FENTANYL CITRATE 0.05 MG/ML IJ SOLN
25.0000 ug | INTRAMUSCULAR | Status: DC | PRN
  Administered 2014-05-29 (×3): 50 ug via INTRAVENOUS

## 2014-05-29 MED ORDER — DEXMEDETOMIDINE BOLUS VIA INFUSION
INTRAVENOUS | Status: DC | PRN
  Administered 2014-05-29 (×3): 4 ug via INTRAVENOUS
  Administered 2014-05-29: 12 ug via INTRAVENOUS
  Administered 2014-05-29 (×2): 4 ug via INTRAVENOUS
  Administered 2014-05-29: 8 ug via INTRAVENOUS

## 2014-05-29 MED ORDER — LACTATED RINGERS IV SOLN
INTRAVENOUS | Status: DC | PRN
  Administered 2014-05-29 (×2): via INTRAVENOUS

## 2014-05-29 MED ORDER — HYDROCODONE-ACETAMINOPHEN 5-325 MG PO TABS
ORAL_TABLET | ORAL | Status: AC
  Administered 2014-05-29: 2
  Filled 2014-05-29: qty 2

## 2014-05-29 SURGICAL SUPPLY — 58 items
BANDAGE ELASTIC 4 VELCRO ST LF (GAUZE/BANDAGES/DRESSINGS) ×3 IMPLANT
BANDAGE ELASTIC 6 VELCRO ST LF (GAUZE/BANDAGES/DRESSINGS) ×3 IMPLANT
BLADE SURG ROTATE 9660 (MISCELLANEOUS) ×6 IMPLANT
BNDG GAUZE ELAST 4 BULKY (GAUZE/BANDAGES/DRESSINGS) ×3 IMPLANT
BONE CANC CHIPS 40CC CAN1/2 (Bone Implant) ×6 IMPLANT
BRUSH SCRUB DISP (MISCELLANEOUS) ×6 IMPLANT
CANISTER SUCT 3000ML (MISCELLANEOUS) ×3 IMPLANT
CHIPS CANC BONE 40CC CAN1/2 (Bone Implant) ×2 IMPLANT
COVER SURGICAL LIGHT HANDLE (MISCELLANEOUS) ×3 IMPLANT
DRAPE C-ARM 42X72 X-RAY (DRAPES) ×3 IMPLANT
DRAPE C-ARMOR (DRAPES) ×3 IMPLANT
DRAPE IMP U-DRAPE 54X76 (DRAPES) ×3 IMPLANT
DRAPE ORTHO SPLIT 77X108 STRL (DRAPES) ×6
DRAPE SURG ORHT 6 SPLT 77X108 (DRAPES) ×3 IMPLANT
DRAPE U-SHAPE 47X51 STRL (DRAPES) ×3 IMPLANT
DRSG ADAPTIC 3X8 NADH LF (GAUZE/BANDAGES/DRESSINGS) ×3 IMPLANT
DRSG PAD ABDOMINAL 8X10 ST (GAUZE/BANDAGES/DRESSINGS) ×3 IMPLANT
ELECT REM PT RETURN 9FT ADLT (ELECTROSURGICAL) ×3
ELECTRODE REM PT RTRN 9FT ADLT (ELECTROSURGICAL) ×1 IMPLANT
EVACUATOR 1/8 PVC DRAIN (DRAIN) IMPLANT
EVACUATOR 3/16  PVC DRAIN (DRAIN)
EVACUATOR 3/16 PVC DRAIN (DRAIN) IMPLANT
GAUZE SPONGE 4X4 12PLY STRL (GAUZE/BANDAGES/DRESSINGS) ×3 IMPLANT
GLOVE BIO SURGEON STRL SZ7.5 (GLOVE) ×3 IMPLANT
GLOVE BIO SURGEON STRL SZ8 (GLOVE) ×3 IMPLANT
GLOVE BIOGEL PI IND STRL 7.5 (GLOVE) ×1 IMPLANT
GLOVE BIOGEL PI IND STRL 8 (GLOVE) ×1 IMPLANT
GLOVE BIOGEL PI INDICATOR 7.5 (GLOVE) ×2
GLOVE BIOGEL PI INDICATOR 8 (GLOVE) ×2
GOWN STRL REUS W/ TWL LRG LVL3 (GOWN DISPOSABLE) ×2 IMPLANT
GOWN STRL REUS W/ TWL XL LVL3 (GOWN DISPOSABLE) ×1 IMPLANT
GOWN STRL REUS W/TWL LRG LVL3 (GOWN DISPOSABLE) ×4
GOWN STRL REUS W/TWL XL LVL3 (GOWN DISPOSABLE) ×2
KIT BASIN OR (CUSTOM PROCEDURE TRAY) ×3 IMPLANT
KIT ROOM TURNOVER OR (KITS) ×3 IMPLANT
NEEDLE 22X1 1/2 (OR ONLY) (NEEDLE) IMPLANT
NS IRRIG 1000ML POUR BTL (IV SOLUTION) ×3 IMPLANT
PACK TOTAL JOINT (CUSTOM PROCEDURE TRAY) ×3 IMPLANT
PACK UNIVERSAL I (CUSTOM PROCEDURE TRAY) ×3 IMPLANT
PAD ARMBOARD 7.5X6 YLW CONV (MISCELLANEOUS) ×6 IMPLANT
PAD CAST 4YDX4 CTTN HI CHSV (CAST SUPPLIES) ×1 IMPLANT
PADDING CAST COTTON 4X4 STRL (CAST SUPPLIES) ×2
PADDING CAST COTTON 6X4 STRL (CAST SUPPLIES) ×3 IMPLANT
SPONGE LAP 18X18 X RAY DECT (DISPOSABLE) ×3 IMPLANT
STAPLER VISISTAT 35W (STAPLE) ×3 IMPLANT
SUCTION FRAZIER TIP 10 FR DISP (SUCTIONS) ×3 IMPLANT
SUT PROLENE 0 CT 2 (SUTURE) IMPLANT
SUT VIC AB 0 CT1 27 (SUTURE) ×4
SUT VIC AB 0 CT1 27XBRD ANBCTR (SUTURE) ×2 IMPLANT
SUT VIC AB 1 CT1 27 (SUTURE) ×4
SUT VIC AB 1 CT1 27XBRD ANBCTR (SUTURE) ×2 IMPLANT
SUT VIC AB 2-0 CT1 27 (SUTURE) ×4
SUT VIC AB 2-0 CT1 TAPERPNT 27 (SUTURE) ×2 IMPLANT
SYR 20ML ECCENTRIC (SYRINGE) IMPLANT
TOWEL OR 17X24 6PK STRL BLUE (TOWEL DISPOSABLE) ×3 IMPLANT
TOWEL OR 17X26 10 PK STRL BLUE (TOWEL DISPOSABLE) ×6 IMPLANT
TRAY FOLEY CATH 16FRSI W/METER (SET/KITS/TRAYS/PACK) IMPLANT
WATER STERILE IRR 1000ML POUR (IV SOLUTION) ×6 IMPLANT

## 2014-05-29 NOTE — Progress Notes (Signed)
Orthopedic Tech Progress Note Patient Details:  Gilman Lee Branch 12/18/1955 [id number]  Ortho Devices Ortho Device/Splint Location: applied ohf to bed Ortho Device/Splint Interventions: Ordered, Application   Nevan Creighton Craig 05/29/2014, 10:27 PM  

## 2014-05-29 NOTE — Anesthesia Procedure Notes (Signed)
Procedure Name: Intubation Date/Time: 05/29/2014 10:14 AM Performed by: Fransisca KaufmannMEYER, Regena Delucchi E Pre-anesthesia Checklist: Patient identified, Emergency Drugs available, Suction available and Timeout performed Patient Re-evaluated:Patient Re-evaluated prior to inductionOxygen Delivery Method: Circle system utilized Preoxygenation: Pre-oxygenation with 100% oxygen Intubation Type: IV induction Ventilation: Mask ventilation without difficulty Laryngoscope Size: Miller and 3 Grade View: Grade I Tube type: Oral Number of attempts: 1 Airway Equipment and Method: Stylet Placement Confirmation: ETT inserted through vocal cords under direct vision,  positive ETCO2 and breath sounds checked- equal and bilateral Secured at: 22 cm Tube secured with: Tape Dental Injury: Teeth and Oropharynx as per pre-operative assessment

## 2014-05-29 NOTE — Transfer of Care (Signed)
Immediate Anesthesia Transfer of Care Note  Patient: Georjean Modearnest Lee Holan  Procedure(s) Performed: Procedure(s): NON UNION FEMUR WITH ALLOGRAFTING AND REMOVAL OF ANTIBIOTIC SPACER  (Left) ALLOGRAFT APPLICATION (Left)  Patient Location: PACU  Anesthesia Type:General  Level of Consciousness: awake, alert , oriented and sedated  Airway & Oxygen Therapy: Patient Spontanous Breathing and Patient connected to face mask oxygen  Post-op Assessment: Report given to PACU RN, Post -op Vital signs reviewed and stable and Patient moving all extremities  Post vital signs: Reviewed and stable  Complications: No apparent anesthesia complications
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
## 2014-05-29 NOTE — Anesthesia Postprocedure Evaluation (Signed)
  Anesthesia Post-op Note  Patient: Danny Hansen  Procedure(s) Performed: Procedure(s): NON UNION FEMUR WITH ALLOGRAFTING AND REMOVAL OF ANTIBIOTIC SPACER  (Left) ALLOGRAFT APPLICATION (Left)  Patient Location: PACU  Anesthesia Type:General  Level of Consciousness: awake and alert   Airway and Oxygen Therapy: Patient Spontanous Breathing and Patient connected to nasal cannula oxygen  Post-op Pain: none  Post-op Assessment: Post-op Vital signs reviewed, Patient's Cardiovascular Status Stable, Respiratory Function Stable, Patent Airway and No signs of Nausea or vomiting  Post-op Vital Signs: Reviewed and stable  Last Vitals:  Filed Vitals:   05/29/14 1215  BP: 160/100  Pulse: 61  Temp:   Resp: 12    Complications: No apparent anesthesia complications
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
## 2014-05-29 NOTE — Anesthesia Preprocedure Evaluation (Addendum)
Anesthesia Evaluation  Patient identified by MRN, date of birth, ID band Patient awake    Reviewed: Allergy & Precautions, H&P , NPO status , Patient's Chart, lab work & pertinent test results, reviewed documented beta blocker date and time   Airway Mallampati: II   Neck ROM: Limited    Dental  (+) Edentulous Upper   Pulmonary Current Smoker,  breath sounds clear to auscultation        Cardiovascular hypertension, Pt. on medications + Peripheral Vascular Disease Rhythm:Regular     Neuro/Psych    GI/Hepatic GERD-  ,(+) Hepatitis -, C  Endo/Other    Renal/GU      Musculoskeletal   Abdominal (+)  Abdomen: soft.    Peds  Hematology   Anesthesia Other Findings   Reproductive/Obstetrics                            Anesthesia Physical Anesthesia Plan  ASA: III  Anesthesia Plan: General   Post-op Pain Management:    Induction: Intravenous  Airway Management Planned: Oral ETT  Additional Equipment:   Intra-op Plan:   Post-operative Plan: Extubation in OR  Informed Consent: I have reviewed the patients History and Physical, chart, labs and discussed the procedure including the risks, benefits and alternatives for the proposed anesthesia with the patient or authorized representative who has indicated his/her understanding and acceptance.     Plan Discussed with:   Anesthesia Plan Comments: (Need to chhck this am labs and EKG, also be good candidate for Precedex, would avoid Tylenol with LFTand ETOH HX, also might benefit from 40mg  of Ketamine on induction to help with postop pain)        Anesthesia Quick Evaluation

## 2014-05-29 NOTE — Plan of Care (Signed)
Problem: Phase I Progression Outcomes Goal: Voiding-avoid urinary catheter unless indicated Outcome: Completed/Met Date Met:  05/29/14     

## 2014-05-29 NOTE — Progress Notes (Signed)
Orthopedic Tech Progress Note Patient Details:  Georjean Modearnest Lee Maloof [birth year] [id number]  Ortho Devices Ortho Device/Splint Location: applied ohf to bed Ortho Device/Splint Interventions: Ordered, Application   Jennye MoccasinHughes, Amarys Sliwinski Craig 05/29/2014, 10:27 PM

## 2014-05-30 ENCOUNTER — Ambulatory Visit: Payer: Self-pay

## 2014-05-30 ENCOUNTER — Encounter (HOSPITAL_COMMUNITY): Payer: Self-pay | Admitting: Orthopedic Surgery

## 2014-05-30 DIAGNOSIS — S72402M Unspecified fracture of lower end of left femur, subsequent encounter for open fracture type I or II with nonunion: Secondary | ICD-10-CM | POA: Diagnosis present

## 2014-05-30 DIAGNOSIS — S72452M Displaced supracondylar fracture without intracondylar extension of lower end of left femur, subsequent encounter for open fracture type I or II with nonunion: Secondary | ICD-10-CM | POA: Diagnosis not present

## 2014-05-30 DIAGNOSIS — Z969 Presence of functional implant, unspecified: Secondary | ICD-10-CM

## 2014-05-30 LAB — BASIC METABOLIC PANEL
Anion gap: 13 (ref 5–15)
BUN: 9 mg/dL (ref 6–23)
CHLORIDE: 100 meq/L (ref 96–112)
CO2: 25 mEq/L (ref 19–32)
Calcium: 9.4 mg/dL (ref 8.4–10.5)
Creatinine, Ser: 0.49 mg/dL — ABNORMAL LOW (ref 0.50–1.35)
GFR calc Af Amer: >90 mL/min (ref >90)
GFR calc non Af Amer: >90 mL/min (ref >90)
Glucose, Bld: 107 mg/dL — ABNORMAL HIGH (ref 70–99)
Potassium: 4.4 mEq/L (ref 3.7–5.3)
Sodium: 138 mEq/L (ref 137–147)

## 2014-05-30 LAB — PROTIME-INR
INR: 1.17 (ref 0.00–1.49)
Prothrombin Time: 15 seconds (ref 11.6–15.2)

## 2014-05-30 MED ORDER — HYDROCODONE-ACETAMINOPHEN 7.5-325 MG PO TABS: 1.0000 | ORAL_TABLET | Freq: Four times a day (QID) | ORAL | Status: DC | PRN

## 2014-05-30 NOTE — Progress Notes (Signed)
Orthopaedic Trauma Service Progress Note  Subjective  Doing very well L leg a little sore Sitting on EOB, no complaints   Review of Systems  Constitutional: Negative for fever and chills.  Respiratory: Negative for shortness of breath and wheezing.   Cardiovascular: Negative for chest pain and palpitations.  Gastrointestinal: Negative for nausea, vomiting and abdominal pain.  Neurological: Negative for tingling and sensory change.     Objective   BP 147/69 mmHg  Pulse 83  Temp(Src) 97.7 F (36.5 C) (Oral)  Resp 20  Ht 5\' 6"  (1.676 m)  Wt 67.586 kg (149 lb)  BMI 24.06 kg/m2  SpO2 100%  Intake/Output      11/19 0701 - 11/20 0700 11/20 0701 - 11/21 0700   P.O. 702    I.V. (mL/kg) 1625 (24)    IV Piggyback 100    Total Intake(mL/kg) 2427 (35.9)    Urine (mL/kg/hr) 2300    Blood 100    Total Output 2400     Net +27          Urine Occurrence 3 x    Stool Occurrence 2 x      Labs  Results for Georjean ModeCOOK, Shawne LEE (MRN [id number]) as of 05/30/2014 09:42  Ref. Range 05/30/2014 05:28  Prothrombin Time Latest Range: 11.6-15.2 seconds 15.0  INR Latest Range: 0.00-1.49  1.17    Exam  Gen: sitting on EOB, NAD Lungs: breathing unlabored Cardiac: Regular Pelvis: Ext:       Left Lower Extremity   Dressing c/d/i  Ext warm  Motor and sensory functions intact   Ext warm  + DP pulse  No DCT    Assessment and Plan   POD/HD#: 901   58 year old male status post open left distal femur fracture with retained antibiotic spacer  1. Open left distal femur fracture with retained antibody spacer s/p removal of spacer and allografting  Touchdown weightbearing left leg  Range of motion as tolerated left knee and hip  Ice and elevate  Dressing changes starting on 06/01/2014   2. Pain management:  Norco  3. ABL anemia/Hemodynamics  Stable  4. Medical issues   Home meds  5. DVT/PE prophylaxis:  Patient on Coumadin for DVT  Patient will need to follow the wellness  clinic for INR monitoring  6. ID:   Completed perioperative antibiotics   7. FEN/Foley/Lines:  As tolerated  8. Dispo:  Discharge home today  Follow-up in 10-14 days with orthopedics    Mearl LatinKeith W. Analiese Krupka, PA-C [facility] [phone] (O) 05/30/2014 9:41 AM

## 2014-05-30 NOTE — Progress Notes (Signed)
Discharge instructions and prescription for norco given and explained to pt.  Pt verbalized understanding of all orders/instructions.  Reinforced pt's status of TDWB.  Extra ace wraps given to pt and dressing changes at home explained to pt.  IV removed. Pt's walker retrieved from PACU and given to pt.  VSS. Pt awaiting ride home. Vanice Sarahhompson, Karianna Gusman L

## 2014-05-30 NOTE — Progress Notes (Signed)
Pt discharged via volunteer w/c services with all belongings. [name], [name]  .

## 2014-05-30 NOTE — Discharge Instructions (Signed)
[facility] Discharge Instructions   General Discharge Instructions  WEIGHT BEARING STATUS: Touchdown weightbearing left leg  RANGE OF MOTION/ACTIVITY: Range of motion as tolerated left knee  Wound care: Daily dressing changes starting on 06/01/2014. Okay to remove Ace wrap. Okay to shower and clean with soap and water. Keep wound covered and dry  Diet: as you were eating previously.  Can use over the counter stool softeners and bowel preparations, such as Miralax, to help with bowel movements.  Narcotics can be constipating.  Be sure to drink plenty of fluids  STOP SMOKING OR USING NICOTINE PRODUCTS!!!!  As discussed nicotine severely impairs your body's ability to heal surgical and traumatic wounds but also impairs bone healing.  Wounds and bone heal by forming microscopic blood vessels (angiogenesis) and nicotine is a vasoconstrictor (essentially, shrinks blood vessels).  Therefore, if vasoconstriction occurs to these microscopic blood vessels they essentially disappear and are unable to deliver necessary nutrients to the healing tissue.  This is one modifiable factor that you can do to dramatically increase your chances of healing your injury.    (This means no smoking, no nicotine gum, patches, etc)  DO NOT USE NONSTEROIDAL ANTI-INFLAMMATORY DRUGS (NSAID'S)  Using products such as Advil (ibuprofen), Aleve (naproxen), Motrin (ibuprofen) for additional pain control during fracture healing can delay and/or prevent the healing response.  If you would like to take over the counter (OTC) medication, Tylenol (acetaminophen) is ok.  However, some narcotic medications that are given for pain control contain acetaminophen as well. Therefore, you should not exceed more than 4000 mg of tylenol in a day if you do not have liver disease.  Also note that there are may OTC medicines, such as cold medicines and allergy medicines that my contain tylenol as well.  If you have any questions about  medications and/or interactions please ask your doctor/PA or your pharmacist.   PAIN MEDICATION USE AND EXPECTATIONS  You have likely been given narcotic medications to help control your pain.  After a traumatic event that results in an fracture (broken bone) with or without surgery, it is ok to use narcotic pain medications to help control one's pain.  We understand that everyone responds to pain differently and each individual patient will be evaluated on a regular basis for the continued need for narcotic medications. Ideally, narcotic medication use should last no more than 6-8 weeks (coinciding with fracture healing).   As a patient it is your responsibility as well to monitor narcotic medication use and report the amount and frequency you use these medications when you come to your office visit.   We would also advise that if you are using narcotic medications, you should take a dose prior to therapy to maximize you participation.  IF YOU ARE ON NARCOTIC MEDICATIONS IT IS NOT PERMISSIBLE TO OPERATE A MOTOR VEHICLE (MOTORCYCLE/CAR/TRUCK/MOPED) OR HEAVY MACHINERY DO NOT MIX NARCOTICS WITH OTHER CNS (CENTRAL NERVOUS SYSTEM) DEPRESSANTS SUCH AS ALCOHOL       ICE AND ELEVATE INJURED/OPERATIVE EXTREMITY  Using ice and elevating the injured extremity above your heart can help with swelling and pain control.  Icing in a pulsatile fashion, such as 20 minutes on and 20 minutes off, can be followed.    Do not place ice directly on skin. Make sure there is a barrier between to skin and the ice pack.    Using frozen items such as frozen peas works well as the conform nicely to the are that needs to be iced.  USE AN ACE WRAP OR TED HOSE FOR SWELLING CONTROL  In addition to icing and elevation, Ace wraps or TED hose are used to help limit and resolve swelling.  It is recommended to use Ace wraps or TED hose until you are informed to stop.    When using Ace Wraps start the wrapping distally (farthest away  from the body) and wrap proximally (closer to the body)   Example: If you had surgery on your leg or thing and you do not have a splint on, start the ace wrap at the toes and work your way up to the thigh        If you had surgery on your upper extremity and do not have a splint on, start the ace wrap at your fingers and work your way up to the upper arm  IF YOU ARE IN A SPLINT OR CAST DO NOT [place]   If your splint gets wet for any reason please contact the office immediately. You may shower in your splint or cast as long as you keep it dry.  This can be done by wrapping in a cast cover or garbage back (or similar)  Do Not stick any thing down your splint or cast such as pencils, money, or hangers to try and scratch yourself with.  If you feel itchy take benadryl as prescribed on the bottle for itching  IF YOU ARE IN A CAM BOOT (BLACK BOOT)  You may remove boot periodically. Perform daily dressing changes as noted below.  Wash the liner of the boot regularly and wear a sock when wearing the boot. It is recommended that you sleep in the boot until told otherwise  CALL THE OFFICE WITH ANY QUESTIONS OR CONCERTS: [phone]     Discharge Pin Site Instructions  Dress pins daily with Kerlix roll starting on POD 2. Wrap the Kerlix so that it tamps the skin down around the pin-skin interface to prevent/limit motion of the skin relative to the pin.  (Pin-skin motion is the primary cause of pain and infection related to external fixator pin sites).  Remove any crust or coagulum that may obstruct drainage with a saline moistened gauze or soap and water.  After POD 3, if there is no discernable drainage on the pin site dressing, the interval for change can by increased to every other day.  You may shower with the fixator, cleaning all pin sites gently with soap and water.  If you have a surgical wound this needs to be completely dry and without drainage before showering.  The  extremity can be lifted by the fixator to facilitate wound care and transfers.  Notify the office/Doctor if you experience increasing drainage, redness, or pain from a pin site, or if you notice purulent (thick, snot-like) drainage.  Discharge Wound Care Instructions  Do NOT apply any ointments, solutions or lotions to pin sites or surgical wounds.  These prevent needed drainage and even though solutions like hydrogen peroxide kill bacteria, they also damage cells lining the pin sites that help fight infection.  Applying lotions or ointments can keep the wounds moist and can cause them to breakdown and open up as well. This can increase the risk for infection. When in doubt call the office.  Surgical incisions should be dressed daily.  If any drainage is noted, use one layer of adaptic, then gauze, Kerlix, and an ace wrap.  Once the incision is completely dry and without drainage, it may  be left open to air out.  Showering may begin 36-48 hours later.  Cleaning gently with soap and water.  Traumatic wounds should be dressed daily as well.    One layer of adaptic, gauze, Kerlix, then ace wrap.  The adaptic can be discontinued once the draining has ceased    If you have a wet to dry dressing: wet the gauze with saline the squeeze as much saline out so the gauze is moist (not soaking wet), place moistened gauze over wound, then place a dry gauze over the moist one, followed by Kerlix wrap, then ace wrap.

## 2014-05-30 NOTE — Evaluation (Signed)
Physical Therapy Evaluation Patient Details Name: Danny Hansen MRN: [id number] DOB: 09/07/1955 Today's Date: 05/30/2014   History of Present Illness  Patient is a 58 y/o male s/p removal of antibiotic spacer with allografting of left distal femur. TDWB LLE. PMH of HTN, Hep C, DVT.    Clinical Impression  Patient presents with functional limitations due to deficits listed in PT problem list (see below). Pt with balance deficits, new PWB status and pain in LLE impacting safe mobility. Pt non compliant with WB status during gait training. Pt reports g/f will be staying with him to provide supervision. Pt would benefit from acute PT to improve transfers, gait, balance and mobility so pt can maximize independence and ease burden of care prior to return home.    Follow Up Recommendations Supervision/Assistance - 24 hour;No PT follow up    Equipment Recommendations  None recommended by PT    Recommendations for Other Services       Precautions / Restrictions Precautions Precautions: Fall Restrictions Weight Bearing Restrictions: Yes LLE Weight Bearing: Touchdown weight bearing      Mobility  Bed Mobility               General bed mobility comments: Received walking back from bathroom without RW.  Transfers Overall transfer level: Needs assistance Equipment used: Rolling walker (2 wheeled) Transfers: Sit to/from Stand Sit to Stand: Min guard         General transfer comment: Min guard for safety. Pt not 100% compliant with TDWB.  Ambulation/Gait Ambulation/Gait assistance: Min guard Ambulation Distance (Feet): 100 Feet Assistive device: Rolling walker (2 wheeled) Gait Pattern/deviations: Step-to pattern;Trunk flexed   Gait velocity interpretation: Below normal speed for age/gender General Gait Details: Pt unsteady gait with occasional NWB vs PWB through LLE. Education provided not to place full weight through LLE however pt not compliant at times. Cues to take  smaller steps.  Stairs            Wheelchair Mobility    Modified Rankin (Stroke Patients Only)       Balance Overall balance assessment: Needs assistance Sitting-balance support: Feet supported;No upper extremity supported Sitting balance-Leahy Scale: Good     Standing balance support: During functional activity Standing balance-Leahy Scale: Fair Standing balance comment: Observed ambulating from bathroom to EOB reaching for furniture for support. Requires BUE support on RW to maintain PWB status.                             Pertinent Vitals/Pain Pain Assessment: No/denies pain    Home Living Family/patient expects to be discharged to:: Private residence Living Arrangements: Other relatives;Spouse/significant other Available Help at Discharge: Friend(s);Available 24 hours/day (lives with gf.) Type of Home: Apartment Home Access: Level entry     Home Layout: One level Home Equipment: Crutches;Walker - standard;Wheelchair - manual      Prior Function Level of Independence: Independent with assistive device(s)         Comments: Uses crutches vs SW for ambulation PTA. Gf assists with cooking/cleaning and some ADls.     Hand Dominance   Dominant Hand: Right    Extremity/Trunk Assessment   Upper Extremity Assessment: Defer to OT evaluation           Lower Extremity Assessment: LLE deficits/detail;Generalized weakness   LLE Deficits / Details: Able to wiggle toes and perform LAQ. no knee buckling noted during TDWB.     Communication   Communication: No  difficulties  Cognition Arousal/Alertness: Awake/alert Behavior During Therapy: WFL for tasks assessed/performed Overall Cognitive Status: Within Functional Limits for tasks assessed                      General Comments General comments (skin integrity, edema, etc.): Education provided on importance of maintaining WB status of LLE. Edema present toes on LLE.    Exercises         Assessment/Plan    PT Assessment Patient needs continued PT services  PT Diagnosis Difficulty walking;Generalized weakness   PT Problem List Decreased strength;Impaired sensation;Decreased activity tolerance;Decreased balance;Decreased knowledge of precautions;Decreased mobility;Decreased safety awareness  PT Treatment Interventions Balance training;Gait training;DME instruction;Patient/family education;Functional mobility training;Therapeutic activities;Therapeutic exercise   PT Goals (Current goals can be found in the Care Plan section) Acute Rehab PT Goals Patient Stated Goal: to get this IV out so I can go home PT Goal Formulation: With patient Time For Goal Achievement: 06/13/14 Potential to Achieve Goals: Good    Frequency Min 3X/week   Barriers to discharge        Co-evaluation               End of Session Equipment Utilized During Treatment: Gait belt Activity Tolerance: Patient tolerated treatment well;Patient limited by fatigue Patient left: in bed;with call bell/phone within reach Nurse Communication: Mobility status;Weight bearing status    Functional Assessment Tool Used: Clinical judgment Functional Limitation: Mobility: Walking and moving around Mobility: Walking and Moving Around Current Status ([id number]): At least 1 percent but less than 20 percent impaired, limited or restricted Mobility: Walking and Moving Around Goal Status [phone]): At least 1 percent but less than 20 percent impaired, limited or restricted    Time: 1017-1031 PT Time Calculation (min) (ACUTE ONLY): 14 min   Charges:   PT Evaluation $Initial PT Evaluation Tier I: 1 Procedure PT Treatments $Gait Training: 8-22 mins   PT G Codes:   Functional Assessment Tool Used: Clinical judgment Functional Limitation: Mobility: Walking and moving around    [place], BriggsShauna A 05/30/2014, 10:41 AM Alvie HeidelbergShauna Folan, PT, DPT [phone]
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
## 2014-06-04 NOTE — Op Note (Signed)
NAME:  Danny Hansen, Danny Hansen                ACCOUNT NO.:  [id number]  MEDICAL RECORD NO.:  [id number]  LOCATION:                                 FACILITY:  PHYSICIAN:  Doralee AlbinoMichael H. Carola FrostHandy, M.D. DATE OF BIRTH:  28-Apr-1956  DATE OF PROCEDURE:  05/29/2014 DATE OF DISCHARGE:  05/30/2014                              OPERATIVE REPORT   PREOPERATIVE DIAGNOSES: 1. Left supracondylar femur, nonunion. 2. Retained antibiotic cement spacer.  POSTOPERATIVE DIAGNOSES: 1. Left supracondylar femur, nonunion. 2. Retained antibiotic cement spacer.  PROCEDURES: 1. Repair of left supracondylar nonunion. 2. Removal of antibiotic cement spacer.  SURGEON:  Doralee AlbinoMichael H. Carola FrostHandy, M.D.  ASSISTANT:  Mearl LatinKeith W Paul, PA-C  ANESTHESIA:  General.  COMPLICATIONS:  None.  TOURNIQUET:  None.  DISPOSITION:  To PACU.  CONDITION:  Stable.  BRIEF SUMMARY AND INDICATION FOR PROCEDURE:  Danny Hansen is a 58- year-old male, status post grade IIIa open left supracondylar femur treated with initial ORIF and placement of an antibiotic cement spacer and a very large supracondylar region defect.  I have discussed with him on multiple occasions the risks and benefits of surgery including, immediately prior to the procedure.  I explained these risks included persistent nonunion, need for further surgery, DVT, PE, heart attack, stroke, loss of motion, need for further surgery including possible hardware removal and many others.  The patient acknowledges risks and did wish to proceed.  BRIEF SUMMARY OF PROCEDURE:  Mr. Adriana SimasCook was given preoperative antibiotics, taken to the operating room, where general anesthesia was induced.  His left lower extremity was prepped and draped in usual sterile fashion.  No tourniquet was used during the procedure.  The medial incision was reopened and dissection carried carefully down to the cement spacer.  The cap was removed and then 3 separate cement component until the entire cavity was  exposed.  This material was then passed off the table.  The SL lining around the cavity was protected, but the area was irrigated thoroughly.  It was then packed with 80 mL of cancellous graft.  The envelope around the spacer was then reapproximated and closed over the graft.  Layered closure was performed superficial to this using PDS with 0, 2-0 and nylon.  Sterile gently compressive dressing was applied and then a soft splint.  The patient was awakened from anesthesia and transported to the PACU in stable condition.  Montez MoritaKeith Paul, PA-C did assist me throughout.  PROGNOSIS:  Mr. Adriana SimasCook will be partial weightbearing with unrestricted range of motion of the knee.  He is at increased risk for persistent nonunion, but the Masquelet technique has been shown to be very effective in this region and were hopeful for healing.  His risk of arthritis is elevated as is symptomatic hardware, which may require future removal, but these will be based upon symptoms and function.     Doralee AlbinoMichael H. Carola FrostHandy, M.D.     MHH/MEDQ  D:  06/03/2014  T:  06/03/2014  Job:  [id number]

## 2014-06-17 ENCOUNTER — Ambulatory Visit (INDEPENDENT_AMBULATORY_CARE_PROVIDER_SITE_OTHER): Payer: Self-pay | Admitting: Internal Medicine

## 2014-06-17 ENCOUNTER — Encounter: Payer: Self-pay | Admitting: Internal Medicine

## 2014-06-17 VITALS — BP 143/93 | HR 90 | Temp 97.7°F | Ht 66.0 in | Wt 150.0 lb

## 2014-06-17 DIAGNOSIS — B182 Chronic viral hepatitis C: Secondary | ICD-10-CM
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
## 2014-06-20 ENCOUNTER — Telehealth: Payer: Self-pay | Admitting: *Deleted

## 2014-06-20 NOTE — Telephone Encounter (Signed)
Patient notified of elastography appointment 12/30 8:00 (7:45 arrival[facility].  Patient knows to be NPO from midnight the evening before.  Pt verbalized understanding, acceptance. Andree CossHowell, Carrissa Taitano M, RN

## 2014-06-27 ENCOUNTER — Ambulatory Visit: Payer: Self-pay | Attending: Internal Medicine

## 2014-07-09 ENCOUNTER — Ambulatory Visit (HOSPITAL_COMMUNITY): Payer: MEDICAID

## 2014-07-24 ENCOUNTER — Encounter (HOSPITAL_COMMUNITY): Payer: Self-pay | Admitting: Orthopedic Surgery

## 2014-08-05 ENCOUNTER — Ambulatory Visit: Payer: Self-pay | Admitting: Internal Medicine

## 2014-09-01 ENCOUNTER — Ambulatory Visit: Payer: Self-pay | Admitting: Internal Medicine

## 2014-09-11 ENCOUNTER — Ambulatory Visit: Payer: Self-pay | Admitting: Internal Medicine

## 2014-10-01 ENCOUNTER — Ambulatory Visit: Payer: Self-pay

## 2016-01-01 ENCOUNTER — Encounter (HOSPITAL_COMMUNITY): Payer: Self-pay

## 2016-01-01 ENCOUNTER — Emergency Department (HOSPITAL_COMMUNITY): Payer: Self-pay

## 2016-01-01 ENCOUNTER — Emergency Department (HOSPITAL_COMMUNITY)
Admission: EM | Admit: 2016-01-01 | Discharge: 2016-01-01 | Disposition: A | Discharge status: Home / Self Care | Payer: Self-pay | Attending: Emergency Medicine | Admitting: Emergency Medicine

## 2016-01-01 ENCOUNTER — Other Ambulatory Visit: Payer: Self-pay

## 2016-01-01 DIAGNOSIS — R0602 Shortness of breath: Secondary | ICD-10-CM

## 2016-01-01 DIAGNOSIS — R030 Elevated blood-pressure reading, without diagnosis of hypertension: Secondary | ICD-10-CM

## 2016-01-01 DIAGNOSIS — IMO0001 Reserved for inherently not codable concepts without codable children: Secondary | ICD-10-CM

## 2016-01-01 DIAGNOSIS — I10 Essential (primary) hypertension: Secondary | ICD-10-CM | POA: Insufficient documentation

## 2016-01-01 DIAGNOSIS — W260XXA Contact with knife, initial encounter: Secondary | ICD-10-CM | POA: Insufficient documentation

## 2016-01-01 DIAGNOSIS — Y929 Unspecified place or not applicable: Secondary | ICD-10-CM | POA: Insufficient documentation

## 2016-01-01 DIAGNOSIS — S81812A Laceration without foreign body, left lower leg, initial encounter: Secondary | ICD-10-CM

## 2016-01-01 DIAGNOSIS — F17210 Nicotine dependence, cigarettes, uncomplicated: Secondary | ICD-10-CM | POA: Insufficient documentation

## 2016-01-01 DIAGNOSIS — Z7901 Long term (current) use of anticoagulants: Secondary | ICD-10-CM | POA: Insufficient documentation

## 2016-01-01 DIAGNOSIS — Y999 Unspecified external cause status: Secondary | ICD-10-CM | POA: Insufficient documentation

## 2016-01-01 DIAGNOSIS — Y939 Activity, unspecified: Secondary | ICD-10-CM | POA: Insufficient documentation

## 2016-01-01 LAB — CBC WITH DIFFERENTIAL/PLATELET
Basophils Absolute: 0 10*3/uL (ref 0.0–0.1)
Basophils Relative: 0 %
EOS ABS: 0 10*3/uL (ref 0.0–0.7)
Eosinophils Relative: 1 %
HEMATOCRIT: 38.5 % — AB (ref 39.0–52.0)
Hemoglobin: 13.4 g/dL (ref 13.0–17.0)
LYMPHS ABS: 3.3 10*3/uL (ref 0.7–4.0)
Lymphocytes Relative: 42 %
MCH: 34.1 pg — AB (ref 26.0–34.0)
MCHC: 34.8 g/dL (ref 30.0–36.0)
MCV: 98 fL (ref 78.0–100.0)
MONOS PCT: 9 %
Monocytes Absolute: 0.7 10*3/uL (ref 0.1–1.0)
NEUTROS PCT: 48 %
Neutro Abs: 3.8 10*3/uL (ref 1.7–7.7)
Platelets: 170 10*3/uL (ref 150–400)
RBC: 3.93 MIL/uL — ABNORMAL LOW (ref 4.22–5.81)
RDW: 14.1 % (ref 11.5–15.5)
WBC: 7.9 10*3/uL (ref 4.0–10.5)

## 2016-01-01 LAB — BASIC METABOLIC PANEL
Anion gap: 10 (ref 5–15)
BUN: 7 mg/dL (ref 6–20)
CHLORIDE: 104 mmol/L (ref 101–111)
CO2: 23 mmol/L (ref 22–32)
CREATININE: 0.79 mg/dL (ref 0.61–1.24)
Calcium: 9.2 mg/dL (ref 8.9–10.3)
GFR calc non Af Amer: >60 mL/min (ref >60)
Glucose, Bld: 98 mg/dL (ref 65–99)
POTASSIUM: 3.7 mmol/L (ref 3.5–5.1)
Sodium: 137 mmol/L (ref 135–145)

## 2016-01-01 LAB — I-STAT TROPONIN, ED: TROPONIN I, POC: 0 ng/mL (ref 0.00–0.08)

## 2016-01-01 LAB — PROTIME-INR
INR: 1.12 (ref 0.00–1.49)
Prothrombin Time: 14.6 seconds (ref 11.6–15.2)

## 2016-01-01 MED ORDER — ACETAMINOPHEN 325 MG PO TABS
650.0000 mg | ORAL_TABLET | Freq: Once | ORAL | Status: AC
  Administered 2016-01-01: 650 mg via ORAL
  Filled 2016-01-01: qty 2

## 2016-01-01 MED ORDER — LIDOCAINE-EPINEPHRINE (PF) 2 %-1:200000 IJ SOLN: 20.0000 mL | Freq: Once | INTRAMUSCULAR | Status: DC

## 2016-01-01 MED ORDER — TRAMADOL HCL 50 MG PO TABS
50.0000 mg | ORAL_TABLET | Freq: Four times a day (QID) | ORAL | Status: DC | PRN
Start: 2016-01-01 — End: 2017-11-27

## 2016-01-01 MED ORDER — LIDOCAINE-EPINEPHRINE 1 %-1:100000 IJ SOLN
20.0000 mL | Freq: Once | INTRAMUSCULAR | Status: AC
  Administered 2016-01-01: 20 mL
  Filled 2016-01-01: qty 1

## 2016-01-01 NOTE — Discharge Instructions (Signed)
Keep wound clean with mild soap and water. Keep area covered with a topical antibiotic ointment and bandage, keep bandage dry, and do not submerge in water for 24 hours. Ice and elevate for additional pain relief and swelling. Alternate between ibuprofen and Tylenol for additional pain relief. Follow up with your primary care doctor or [facility] in approximately 7 days for wound recheck and suture removal. Use pain medication only as needed for severe pain - This can make you very drowsy - please do not drink alcohol, operate heavy machinery or drive on this medication. Tylenol as needed for mild to moderate pain. Monitor area for signs of infection to include, but not limited to: increasing pain, spreading redness, drainage/pus, worsening swelling, or fevers. Return to emergency department for chest pain, shortness of breath, new or worsening symptoms, any additional concerns.  Your blood pressure was elevated today. You need to follow up with your primary care physician in 1 week for blood pressure check.   WOUND CARE  Keep area clean and dry for 24 hours. Do not remove bandage, if applied.  After 24 hours,you should change it at least once a day. Also, change the dressing if it becomes wet or dirty, or as directed by your caregiver.   Wash the wound with soap and water 2 times a day. Rinse the wound off with water to remove all soap. Pat the wound dry with a clean towel.   You may shower as usual after the first 24 hours. Do not soak the wound in water until the sutures are removed.   Once the wound has healed, scarring can be minimized by covering the wound with sunscreen during the day for 1 full year.  Do not apply any ointments or creams to the wound while stitches/staples are in place, as this may cause delayed healing.  Return if you experience any of the following signs of infection: Swelling, redness, pus drainage, streaking, fever >101.0 F  Return if you  experience excessive bleeding that does not stop after 15-20 minutes of constant, firm pressure.
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
## 2016-04-22 IMAGING — CT CT ABD-PELV W/ CM
2 of 5 series · 16 of 46 positions shown, 18 images · IV contrast (Omni 300)
Comparison: 04/10/2014

CLINICAL DATA: Low abdominal pain.  Initial encounter

EXAM:
CT ABDOMEN AND PELVIS WITH CONTRAST
TECHNIQUE: Multidetector CT imaging of the abdomen and pelvis was performed
using the standard protocol following bolus administration of
intravenous contrast.
CONTRAST:  100mL OMNIPAQUE IOHEXOL 300 MG/ML  SOLN

[Series 2: abd/ pelvis 5.0 i30f 1 · axial · 0.70mm/px · z∈[+908,+1328]mm · 13 of 94 slices shown, 15 images]
[im 5/94  soft-tissue]
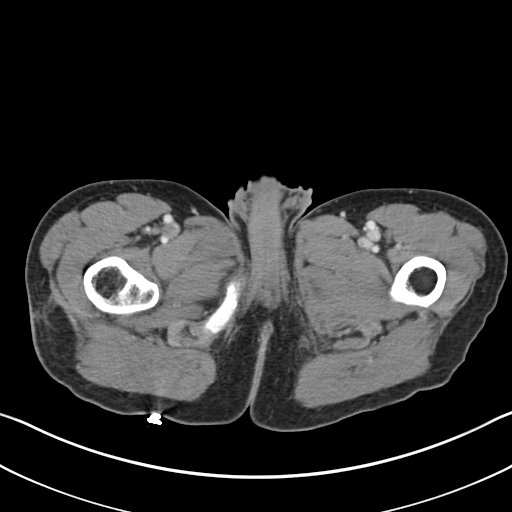
[im 5/94  bone]
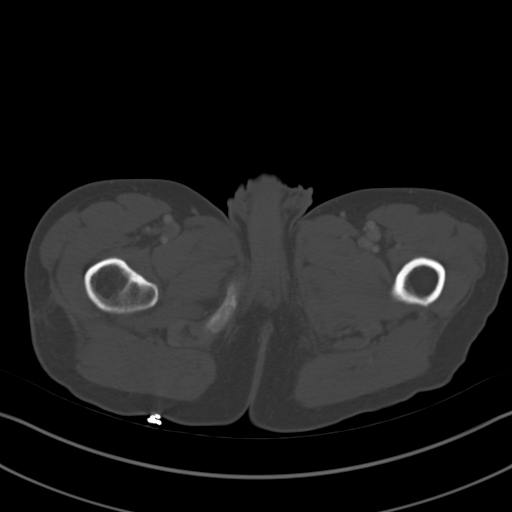
[im 15/94  soft-tissue]
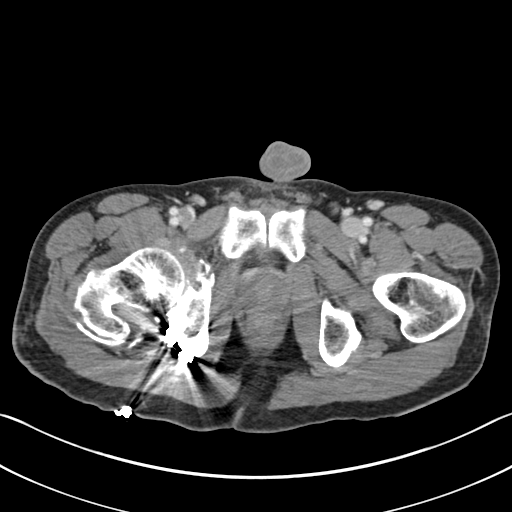
[im 20/94  soft-tissue]
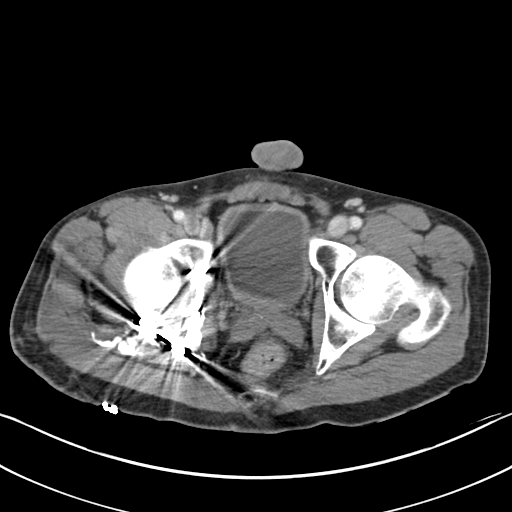
[im 25/94  soft-tissue]
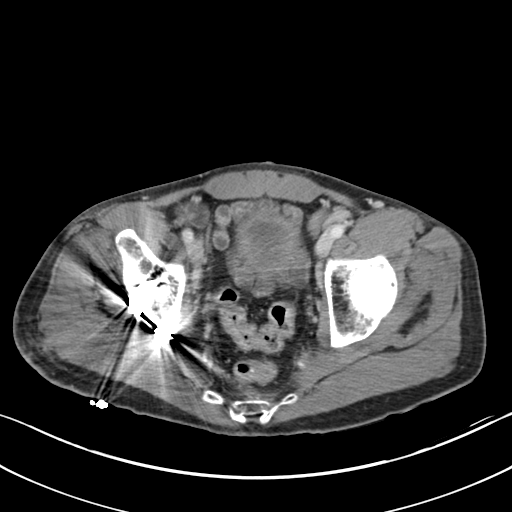
[im 35/94  soft-tissue]
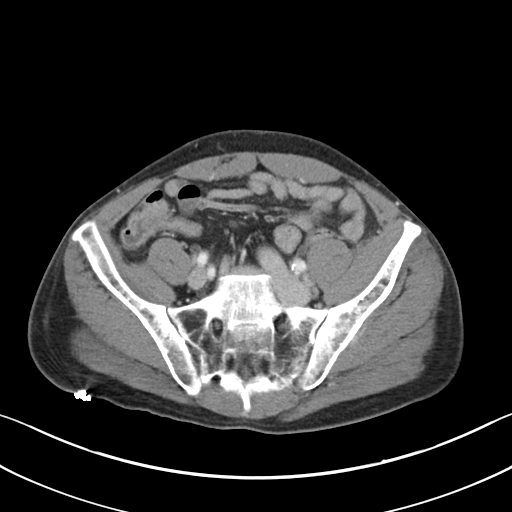
[im 40/94  soft-tissue]
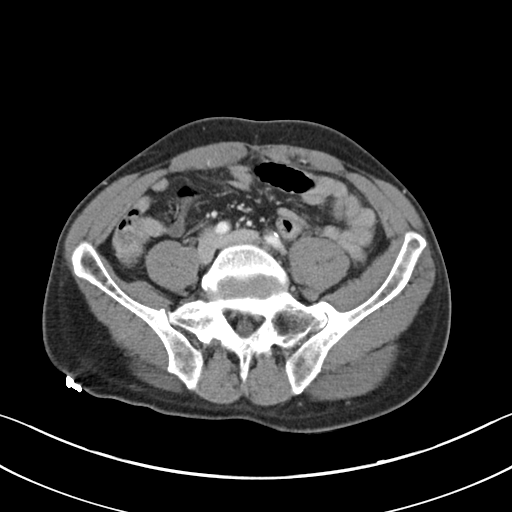
[im 49/94  soft-tissue]
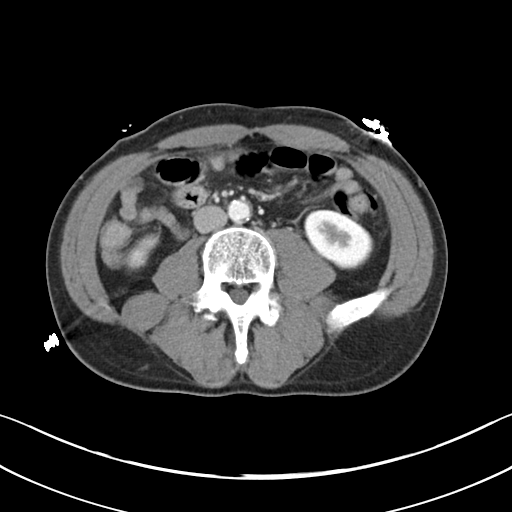
[im 54/94  soft-tissue]
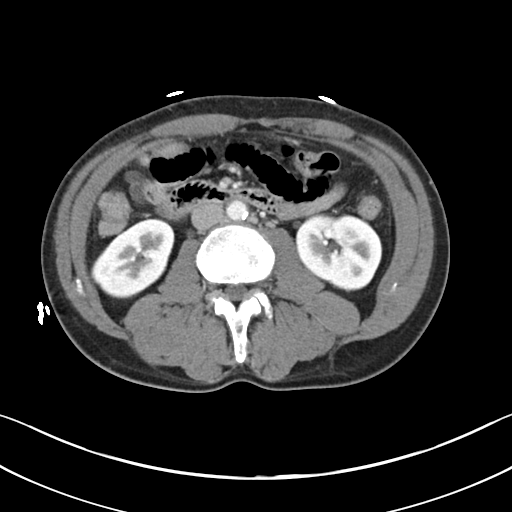
[im 59/94  soft-tissue]
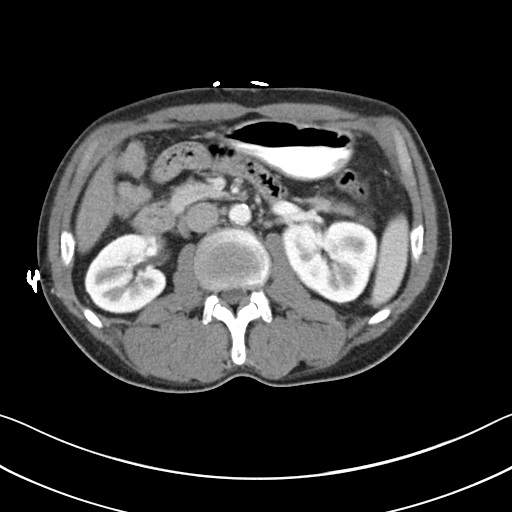
[im 59/94  bone]
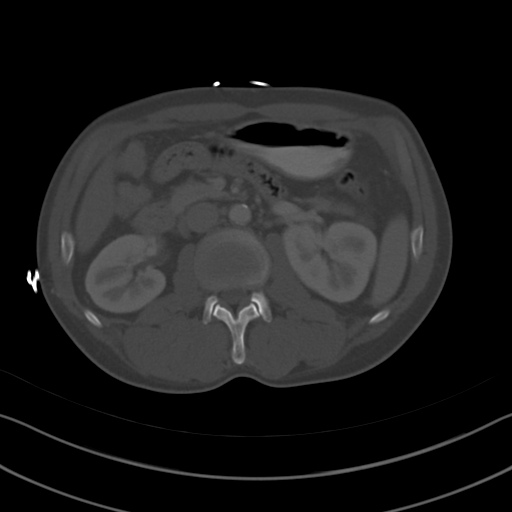
[im 69/94  soft-tissue]
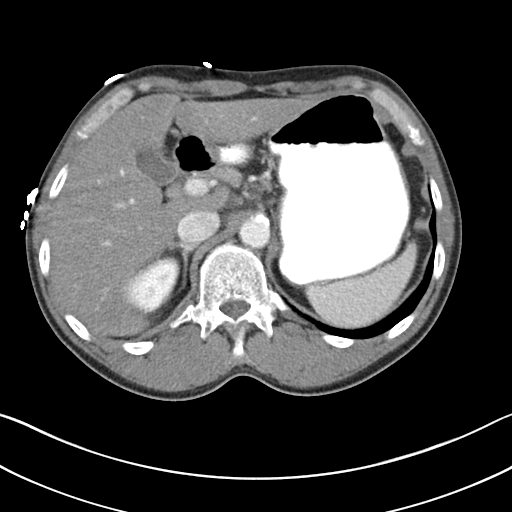
[im 74/94  soft-tissue]
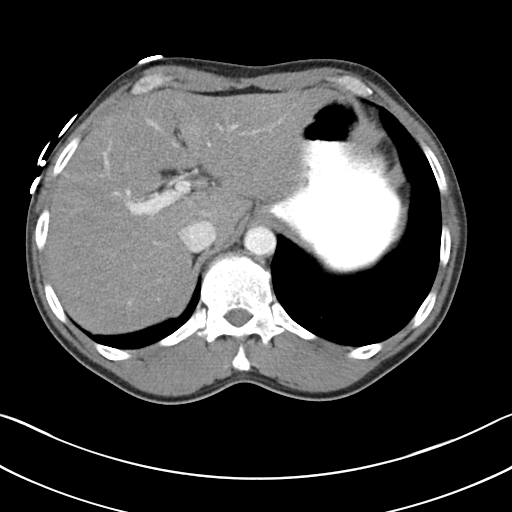
[im 79/94  soft-tissue]
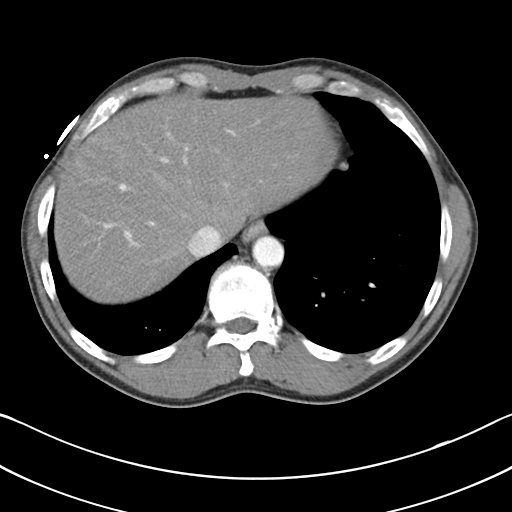
[im 89/94  soft-tissue]
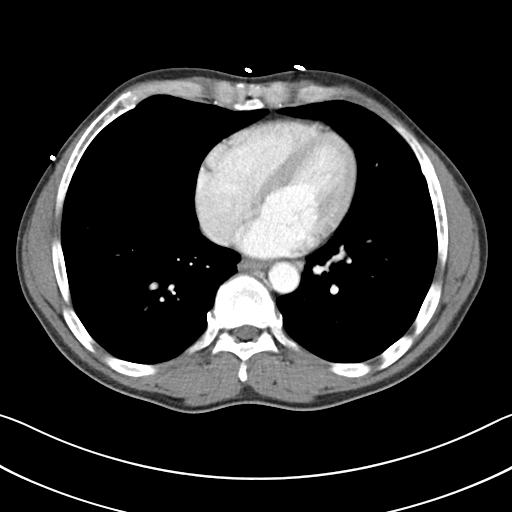

[Series 5: coronals · coronal · 0.70mm/px · 3 of 124 slices shown]
[im 42/124  soft-tissue]
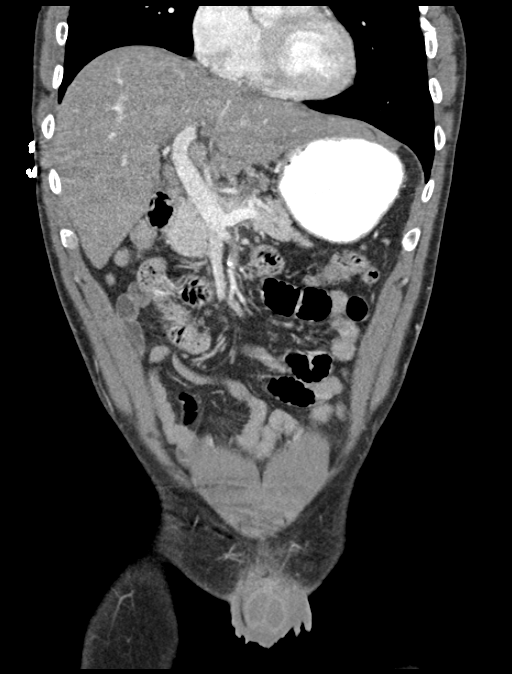
[im 55/124  soft-tissue]
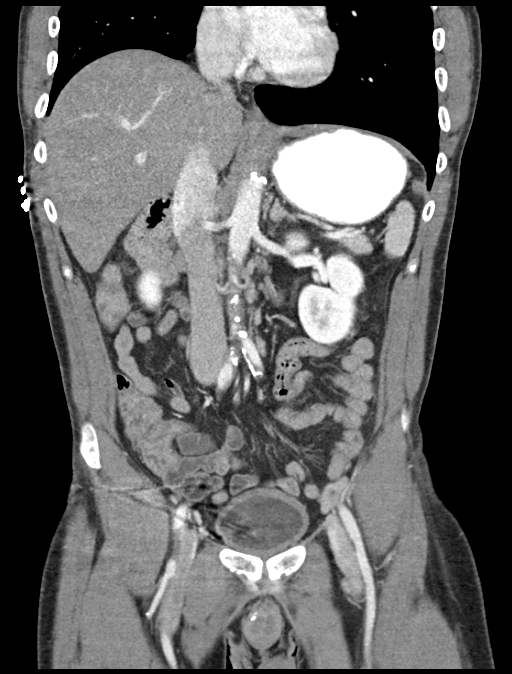
[im 69/124  soft-tissue]
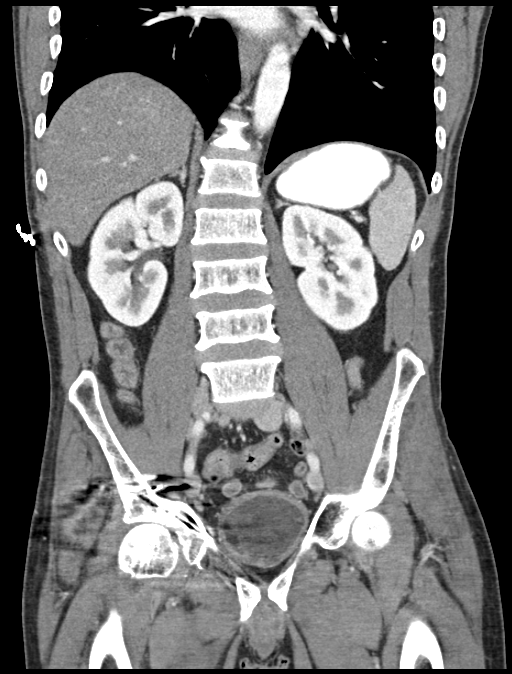

[16 of 46 positions shown; findings below may reference images not displayed]

FINDINGS: BODY WALL: Unremarkable.

LOWER CHEST: Faint calcification noted the aortic valve, unusual for
age.

There is a prominent anterior juxta diaphragmatic lymph node on the
right which is stable from 7877, likely related to chronic liver
disease in this patient with history of hepatitis-C.

ABDOMEN/PELVIS:

Liver: Fatty infiltration of the liver.  No focal abnormality.

Biliary: No evidence of biliary obstruction or stone.

Pancreas: Unremarkable.

Spleen: Unremarkable.

Adrenals: Unremarkable.

Kidneys and ureters: No hydronephrosis or stone.

Bladder: Nonspecific circumferential thickening of the bladder
without perivesicular fat infiltration.

Reproductive: Unremarkable.

Bowel: No obstruction. Normal appendix.

Retroperitoneum: No mass or adenopathy.

Peritoneum: No ascites or pneumoperitoneum.

Vascular: Diffuse atherosclerosis which is age advanced. There is
notable noncalcified plaque at the proximal SMA without significant
stenosis.

OSSEOUS: Remote right acetabular fracture status post ORIF. No acute
osseous findings.
IMPRESSION: 1. Bladder wall thickening; urinalysis can further evaluate for
cystitis. Otherwise, no explanation for low abdominal pain.
2. Chronic findings are stable from 7877 and noted above.

## 2016-05-10 ENCOUNTER — Ambulatory Visit: Payer: No Typology Code available for payment source | Admitting: Radiation Oncology

## 2016-05-11 ENCOUNTER — Ambulatory Visit: Payer: No Typology Code available for payment source

## 2016-05-12 ENCOUNTER — Ambulatory Visit: Payer: No Typology Code available for payment source

## 2016-05-13 ENCOUNTER — Ambulatory Visit: Payer: No Typology Code available for payment source

## 2016-05-16 ENCOUNTER — Ambulatory Visit: Payer: No Typology Code available for payment source

## 2017-01-07 ENCOUNTER — Emergency Department (HOSPITAL_COMMUNITY)
Admission: EM | Admit: 2017-01-07 | Discharge: 2017-01-07 | Disposition: A | Discharge status: Home / Self Care | Payer: Self-pay | Attending: Emergency Medicine | Admitting: Emergency Medicine

## 2017-01-07 ENCOUNTER — Encounter (HOSPITAL_COMMUNITY): Payer: Self-pay | Admitting: Emergency Medicine

## 2017-01-07 DIAGNOSIS — Z7901 Long term (current) use of anticoagulants: Secondary | ICD-10-CM | POA: Insufficient documentation

## 2017-01-07 DIAGNOSIS — I10 Essential (primary) hypertension: Secondary | ICD-10-CM | POA: Insufficient documentation

## 2017-01-07 DIAGNOSIS — F17210 Nicotine dependence, cigarettes, uncomplicated: Secondary | ICD-10-CM | POA: Insufficient documentation

## 2017-01-07 DIAGNOSIS — K739 Chronic hepatitis, unspecified: Secondary | ICD-10-CM | POA: Insufficient documentation

## 2017-01-07 DIAGNOSIS — R42 Dizziness and giddiness: Secondary | ICD-10-CM | POA: Insufficient documentation

## 2017-01-07 LAB — BASIC METABOLIC PANEL
Anion gap: 8 (ref 5–15)
BUN: 11 mg/dL (ref 6–20)
CO2: 24 mmol/L (ref 22–32)
Calcium: 9.1 mg/dL (ref 8.9–10.3)
Chloride: 107 mmol/L (ref 101–111)
Creatinine, Ser: 0.64 mg/dL (ref 0.61–1.24)
GFR calc Af Amer: >60 mL/min (ref >60)
GFR calc non Af Amer: >60 mL/min (ref >60)
Glucose, Bld: 99 mg/dL (ref 65–99)
Potassium: 3.8 mmol/L (ref 3.5–5.1)
Sodium: 139 mmol/L (ref 135–145)

## 2017-01-07 LAB — CBC
HCT: 36.3 % — ABNORMAL LOW (ref 39.0–52.0)
Hemoglobin: 12.3 g/dL — ABNORMAL LOW (ref 13.0–17.0)
MCH: 33.5 pg (ref 26.0–34.0)
MCHC: 33.9 g/dL (ref 30.0–36.0)
MCV: 98.9 fL (ref 78.0–100.0)
Platelets: 138 10*3/uL — ABNORMAL LOW (ref 150–400)
RBC: 3.67 MIL/uL — ABNORMAL LOW (ref 4.22–5.81)
RDW: 12.6 % (ref 11.5–15.5)
WBC: 4.6 10*3/uL (ref 4.0–10.5)

## 2017-01-07 LAB — ETHANOL: Alcohol, Ethyl (B): 66 mg/dL — ABNORMAL HIGH (ref <5)

## 2017-01-07 MED ORDER — MECLIZINE HCL 12.5 MG PO TABS: 12.5000 mg | ORAL_TABLET | Freq: Three times a day (TID) | ORAL | 0 refills | Status: DC | PRN

## 2017-01-07 MED ORDER — NAPROXEN 500 MG PO TABS: 500.0000 mg | ORAL_TABLET | Freq: Two times a day (BID) | ORAL | 0 refills | Status: DC
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
## 2017-01-07 NOTE — ED Triage Notes (Addendum)
Pt reports he has been dizzy off and on for four days. Pt is not currently dizzy. Denies nausea or any associated symptoms.  Pt states he takes a pain pill for his feet. Pt states it sometimes makes him dizzy. Pt requesting a pain medication prescription.  Again, pt is not having any symptoms at present. Denies chest pain.  Pt states he drinks daily, 2 beers 12 ounce. Pt states he has not drunken today.

## 2017-01-07 NOTE — Discharge Instructions (Signed)
Take your medication as prescribed as needed for dizziness. I recommend taking her prescription of naproxen as prescribed as needed for your chronic leg pain. Do not take Ibuprofen while your are taking Naproxen. Follow-up with your primary care provider within the next week for follow-up evaluation regarding your dizziness and chronic pain. Please return to the Emergency Department if symptoms worsen or new onset of fever, headache, visual changes, chest pain, shortness of breath, vomiting, numbness, weakness, syncope, seizure, slurred speech, facial weakness.

## 2017-01-14 ENCOUNTER — Emergency Department (HOSPITAL_COMMUNITY): Payer: Self-pay

## 2017-01-14 ENCOUNTER — Encounter (HOSPITAL_COMMUNITY): Payer: Self-pay | Admitting: Emergency Medicine

## 2017-01-14 ENCOUNTER — Emergency Department (HOSPITAL_COMMUNITY)
Admission: EM | Admit: 2017-01-14 | Discharge: 2017-01-14 | Disposition: A | Discharge status: Home / Self Care | Payer: Self-pay | Attending: Emergency Medicine | Admitting: Emergency Medicine

## 2017-01-14 DIAGNOSIS — F17210 Nicotine dependence, cigarettes, uncomplicated: Secondary | ICD-10-CM | POA: Insufficient documentation

## 2017-01-14 DIAGNOSIS — Z7901 Long term (current) use of anticoagulants: Secondary | ICD-10-CM | POA: Insufficient documentation

## 2017-01-14 DIAGNOSIS — S81812A Laceration without foreign body, left lower leg, initial encounter: Secondary | ICD-10-CM | POA: Insufficient documentation

## 2017-01-14 DIAGNOSIS — Y929 Unspecified place or not applicable: Secondary | ICD-10-CM | POA: Insufficient documentation

## 2017-01-14 DIAGNOSIS — Y9389 Activity, other specified: Secondary | ICD-10-CM | POA: Insufficient documentation

## 2017-01-14 DIAGNOSIS — Z79899 Other long term (current) drug therapy: Secondary | ICD-10-CM | POA: Insufficient documentation

## 2017-01-14 DIAGNOSIS — Y999 Unspecified external cause status: Secondary | ICD-10-CM | POA: Insufficient documentation

## 2017-01-14 DIAGNOSIS — I10 Essential (primary) hypertension: Secondary | ICD-10-CM | POA: Insufficient documentation

## 2017-01-14 DIAGNOSIS — S0990XA Unspecified injury of head, initial encounter: Secondary | ICD-10-CM

## 2017-01-14 LAB — CBC
HCT: 31.2 % — ABNORMAL LOW (ref 39.0–52.0)
Hemoglobin: 10.6 g/dL — ABNORMAL LOW (ref 13.0–17.0)
MCH: 33.3 pg (ref 26.0–34.0)
MCHC: 34 g/dL (ref 30.0–36.0)
MCV: 98.1 fL (ref 78.0–100.0)
PLATELETS: 179 10*3/uL (ref 150–400)
RBC: 3.18 MIL/uL — ABNORMAL LOW (ref 4.22–5.81)
RDW: 12.5 % (ref 11.5–15.5)
WBC: 7.4 10*3/uL (ref 4.0–10.5)

## 2017-01-14 LAB — COMPREHENSIVE METABOLIC PANEL
ALT: 50 U/L (ref 17–63)
AST: 56 U/L — AB (ref 15–41)
Albumin: 3.7 g/dL (ref 3.5–5.0)
Alkaline Phosphatase: 79 U/L (ref 38–126)
Anion gap: 12 (ref 5–15)
BUN: 14 mg/dL (ref 6–20)
CHLORIDE: 102 mmol/L (ref 101–111)
CO2: 17 mmol/L — AB (ref 22–32)
Calcium: 8.8 mg/dL — ABNORMAL LOW (ref 8.9–10.3)
Creatinine, Ser: 0.92 mg/dL (ref 0.61–1.24)
GFR calc Af Amer: >60 mL/min (ref >60)
GFR calc non Af Amer: >60 mL/min (ref >60)
Glucose, Bld: 99 mg/dL (ref 65–99)
POTASSIUM: 4.4 mmol/L (ref 3.5–5.1)
Sodium: 131 mmol/L — ABNORMAL LOW (ref 135–145)
Total Bilirubin: 0.7 mg/dL (ref 0.3–1.2)
Total Protein: 7.6 g/dL (ref 6.5–8.1)

## 2017-01-14 MED ORDER — CEPHALEXIN 250 MG PO CAPS
500.0000 mg | ORAL_CAPSULE | Freq: Once | ORAL | Status: AC
  Administered 2017-01-14: 500 mg via ORAL
  Filled 2017-01-14: qty 2

## 2017-01-14 MED ORDER — TETANUS-DIPHTH-ACELL PERTUSSIS 5-2.5-18.5 LF-MCG/0.5 IM SUSP
0.5000 mL | Freq: Once | INTRAMUSCULAR | Status: AC
Start: 2017-01-14 — End: 2017-01-14
  Administered 2017-01-14: 0.5 mL via INTRAMUSCULAR
  Filled 2017-01-14: qty 0.5

## 2017-01-14 MED ORDER — CEPHALEXIN 500 MG PO CAPS: 500.0000 mg | ORAL_CAPSULE | Freq: Four times a day (QID) | ORAL | 0 refills | Status: DC

## 2017-01-14 MED ORDER — MORPHINE SULFATE (PF) 4 MG/ML IV SOLN
4.0000 mg | Freq: Once | INTRAVENOUS | Status: DC
  Filled 2017-01-14: qty 1

## 2017-01-14 MED ORDER — LIDOCAINE-EPINEPHRINE (PF) 2 %-1:200000 IJ SOLN
10.0000 mL | Freq: Once | INTRAMUSCULAR | Status: AC
  Administered 2017-01-14: 10 mL
  Filled 2017-01-14: qty 20
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
## 2017-01-14 NOTE — ED Provider Notes (Signed)
LACERATION REPAIR Performed by: Orson SlickAndrew Dallis Czaja Authorized by: Orson SlickAndrew Atari Novick, Jacalyn LefevreJulie Haviland Consent: Verbal consent obtained. Risks and benefits: risks, benefits and alternatives were discussed Consent given by: patient Patient identity confirmed: provided demographic data Prepped and Draped in normal sterile fashion Wound explored  Laceration Location: left anterior thigh   Laceration Length: 7cm  No Foreign Bodies seen or palpated  Anesthesia: local infiltration  Local anesthetic: lidocaine 1% with epinephrine  Anesthetic total: 8 ml  Irrigation method: syringe Amount of cleaning: standard  Deep fascial layer closure, 5 sutures, fast absorbing plain gut, simple uninterupted  Skin closure: 3-0 prolene  Number of sutures: 13  Technique: simple uninterrupted  Patient tolerance: Patient tolerated the procedure well with no immediate complications.      Orson Slickolson, Janyia Guion, MD 01/14/17 Lauretta [id number]    Jacalyn LefevreHaviland, Julie, MD 01/14/17 [phone]
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
## 2017-01-14 NOTE — ED Triage Notes (Signed)
MD notified of patient's presentation. Patient to go back to room next.

## 2017-01-28 ENCOUNTER — Emergency Department (HOSPITAL_COMMUNITY)
Admission: EM | Admit: 2017-01-28 | Discharge: 2017-01-28 | Disposition: A | Discharge status: Home / Self Care | Payer: Self-pay | Attending: Emergency Medicine | Admitting: Emergency Medicine

## 2017-01-28 ENCOUNTER — Encounter (HOSPITAL_COMMUNITY): Payer: Self-pay | Admitting: Emergency Medicine

## 2017-01-28 DIAGNOSIS — Z4802 Encounter for removal of sutures: Secondary | ICD-10-CM | POA: Insufficient documentation

## 2017-01-28 NOTE — ED Notes (Signed)
Suture removal kit and bedside

## 2017-01-28 NOTE — ED Triage Notes (Signed)
Pt reports here for suture removal from left thigh area. Pt has sutures placed 2 weeks ago and has been keeping area clean and dry.

## 2017-01-28 NOTE — ED Notes (Signed)
Declined W/C at D/C and was escorted to lobby by RN. 
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
## 2017-05-08 ENCOUNTER — Encounter (HOSPITAL_COMMUNITY): Payer: Self-pay

## 2017-05-08 ENCOUNTER — Emergency Department (HOSPITAL_COMMUNITY)
Admission: EM | Admit: 2017-05-08 | Discharge: 2017-05-09 | Disposition: A | Discharge status: Home / Self Care | Payer: Self-pay | Attending: Emergency Medicine | Admitting: Emergency Medicine

## 2017-05-08 DIAGNOSIS — Z5321 Procedure and treatment not carried out due to patient leaving prior to being seen by health care provider: Secondary | ICD-10-CM | POA: Insufficient documentation

## 2017-05-08 NOTE — ED Triage Notes (Signed)
Pt state he started having pain in his right baby toe for about a month causing pain when he walks; pt states he had to buy bigger shoes; pt c/o pain at 10/10 on arrival. Pt's toe is slightly red at triage; Pt states he can not walk on it but [facility],RN

## 2017-05-08 NOTE — ED Notes (Signed)
EMT called for room assignment, no response

## 2017-05-08 NOTE — ED Notes (Signed)
No answer from pt when called to be roomed x2

## 2017-09-10 ENCOUNTER — Encounter (HOSPITAL_COMMUNITY): Payer: Self-pay | Admitting: *Deleted

## 2017-09-10 ENCOUNTER — Emergency Department (HOSPITAL_COMMUNITY)
Admission: EM | Admit: 2017-09-10 | Discharge: 2017-09-10 | Disposition: A | Discharge status: Home / Self Care | Payer: Self-pay | Attending: Emergency Medicine | Admitting: Emergency Medicine

## 2017-09-10 ENCOUNTER — Other Ambulatory Visit: Payer: Self-pay

## 2017-09-10 DIAGNOSIS — M25562 Pain in left knee: Secondary | ICD-10-CM | POA: Insufficient documentation

## 2017-09-10 DIAGNOSIS — Z79899 Other long term (current) drug therapy: Secondary | ICD-10-CM | POA: Insufficient documentation

## 2017-09-10 DIAGNOSIS — I10 Essential (primary) hypertension: Secondary | ICD-10-CM | POA: Insufficient documentation

## 2017-09-10 DIAGNOSIS — M25512 Pain in left shoulder: Secondary | ICD-10-CM | POA: Insufficient documentation

## 2017-09-10 DIAGNOSIS — F17210 Nicotine dependence, cigarettes, uncomplicated: Secondary | ICD-10-CM | POA: Insufficient documentation

## 2017-09-10 MED ORDER — NAPROXEN 500 MG PO TABS: 500.0000 mg | ORAL_TABLET | Freq: Two times a day (BID) | ORAL | 0 refills | Status: DC

## 2017-09-10 MED ORDER — PREDNISONE 50 MG PO TABS: 50.0000 mg | ORAL_TABLET | Freq: Every day | ORAL | 0 refills | Status: DC

## 2017-09-10 NOTE — ED Notes (Signed)
Declined W/C at D/C and was escorted to lobby by RN. 

## 2017-09-10 NOTE — ED Triage Notes (Signed)
Pt reports having left knee pain and left shoulder pain for extended amount of time, pt thinks its arthritis. Ambulatory at triage.
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
## 2017-09-10 NOTE — Discharge Instructions (Signed)
You were seen in the emergency department today for pain in your left shoulder and your left knee.  We have given you a prescription for Naproxen.  Naproxen is a nonsteroidal anti-inflammatory medication that will help with pain and swelling. Be sure to take this medication as prescribed with food, 1 pill every 12 hours,  It should be taken with food, as it can cause stomach upset, and more seriously, stomach bleeding. Do not take other nonsteroidal anti-inflammatory medications with this such as Advil, Motrin, or Aleve.   You may take Tylenol with this safely per bottle dosing instructions.  Follow-up with Dr. Carola FrostHandy, your previous orthopedic doctor, within 1 week for reevaluation and further management of your shoulder and knee pain.  Return to the emergency department for new or worsening symptoms including but not limited to fever, chills, redness of the joints, swelling of the joints, numbness, weakness, or any other concerns you may have.  Additionally your blood pressure was elevated in the emergency department today, it is important that you follow-up with your primary care doctor in 1 week for recheck of this.  Vitals:   09/10/17 1519  BP: (!) 162/95  Pulse: 95  Resp: 18  Temp: 99.5 F (37.5 C)  SpO2: 98%

## 2017-11-26 ENCOUNTER — Emergency Department (HOSPITAL_COMMUNITY): Payer: Self-pay

## 2017-11-26 ENCOUNTER — Emergency Department (HOSPITAL_COMMUNITY)
Admission: EM | Admit: 2017-11-26 | Discharge: 2017-11-27 | Disposition: A | Discharge status: Home / Self Care | Payer: Self-pay | Attending: Emergency Medicine | Admitting: Emergency Medicine

## 2017-11-26 ENCOUNTER — Encounter (HOSPITAL_COMMUNITY): Payer: Self-pay

## 2017-11-26 ENCOUNTER — Other Ambulatory Visit: Payer: Self-pay

## 2017-11-26 DIAGNOSIS — J189 Pneumonia, unspecified organism: Secondary | ICD-10-CM

## 2017-11-26 DIAGNOSIS — I10 Essential (primary) hypertension: Secondary | ICD-10-CM | POA: Insufficient documentation

## 2017-11-26 DIAGNOSIS — F17210 Nicotine dependence, cigarettes, uncomplicated: Secondary | ICD-10-CM | POA: Insufficient documentation

## 2017-11-26 DIAGNOSIS — J181 Lobar pneumonia, unspecified organism: Secondary | ICD-10-CM | POA: Insufficient documentation

## 2017-11-26 LAB — CBC
HEMATOCRIT: 37.5 % — AB (ref 39.0–52.0)
HEMOGLOBIN: 12.8 g/dL — AB (ref 13.0–17.0)
MCH: 33 pg (ref 26.0–34.0)
MCHC: 34.1 g/dL (ref 30.0–36.0)
MCV: 96.6 fL (ref 78.0–100.0)
Platelets: 283 10*3/uL (ref 150–400)
RBC: 3.88 MIL/uL — AB (ref 4.22–5.81)
RDW: 13.2 % (ref 11.5–15.5)
WBC: 5.3 10*3/uL (ref 4.0–10.5)

## 2017-11-26 LAB — BASIC METABOLIC PANEL
ANION GAP: 11 (ref 5–15)
BUN: 8 mg/dL (ref 6–20)
CALCIUM: 9.6 mg/dL (ref 8.9–10.3)
CHLORIDE: 103 mmol/L (ref 101–111)
CO2: 24 mmol/L (ref 22–32)
Creatinine, Ser: 0.71 mg/dL (ref 0.61–1.24)
GFR calc non Af Amer: >60 mL/min (ref >60)
GLUCOSE: 99 mg/dL (ref 65–99)
POTASSIUM: 3.8 mmol/L (ref 3.5–5.1)
Sodium: 138 mmol/L (ref 135–145)

## 2017-11-26 LAB — I-STAT TROPONIN, ED: TROPONIN I, POC: 0 ng/mL (ref 0.00–0.08)

## 2017-11-26 NOTE — ED Triage Notes (Signed)
:  Pt reports that he has had a cough, productive with yellow sputum for several weeks, central CP, denies SOB and fevers.

## 2017-11-27 MED ORDER — DOXYCYCLINE HYCLATE 100 MG PO CAPS: 100.0000 mg | ORAL_CAPSULE | Freq: Two times a day (BID) | ORAL | 0 refills | Status: AC

## 2017-11-27 MED ORDER — AMOXICILLIN-POT CLAVULANATE 875-125 MG PO TABS
1.0000 | ORAL_TABLET | Freq: Once | ORAL | Status: AC
  Administered 2017-11-27: 1 via ORAL
  Filled 2017-11-27: qty 1

## 2017-11-27 MED ORDER — AMOXICILLIN-POT CLAVULANATE 875-125 MG PO TABS: 1.0000 | ORAL_TABLET | Freq: Two times a day (BID) | ORAL | 0 refills | Status: AC

## 2017-11-27 MED ORDER — AEROCHAMBER PLUS FLO-VU LARGE MISC
Status: AC
  Administered 2017-11-27: 02:00:00
  Filled 2017-11-27: qty 1

## 2017-11-27 MED ORDER — HYDROCODONE-ACETAMINOPHEN 5-325 MG PO TABS: 1.0000 | ORAL_TABLET | Freq: Four times a day (QID) | ORAL | 0 refills | Status: AC | PRN

## 2017-11-27 MED ORDER — DOXYCYCLINE HYCLATE 100 MG PO TABS
100.0000 mg | ORAL_TABLET | Freq: Once | ORAL | Status: AC
Start: 2017-11-27 — End: 2017-11-27
  Administered 2017-11-27: 100 mg via ORAL
  Filled 2017-11-27: qty 1

## 2017-11-27 MED ORDER — ALBUTEROL SULFATE HFA 108 (90 BASE) MCG/ACT IN AERS
2.0000 | INHALATION_SPRAY | Freq: Once | RESPIRATORY_TRACT | Status: AC
  Administered 2017-11-27: 2 via RESPIRATORY_TRACT
  Filled 2017-11-27: qty 6.7
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
## 2017-11-27 NOTE — ED Notes (Signed)
Pt complains of chest pain upon coughing for the past month.
